# Patient Record
Sex: Female | Born: 1955 | Race: White | Hispanic: No | Marital: Married | Smoking: Never smoker
Health system: Southern US, Community
[De-identification: ages and names within clinical notes are randomized; demographics above are authoritative.]

## PROBLEM LIST (undated history)

## (undated) DIAGNOSIS — M1990 Unspecified osteoarthritis, unspecified site: Secondary | ICD-10-CM

## (undated) DIAGNOSIS — G43909 Migraine, unspecified, not intractable, without status migrainosus: Secondary | ICD-10-CM

## (undated) DIAGNOSIS — I493 Ventricular premature depolarization: Secondary | ICD-10-CM

## (undated) DIAGNOSIS — E785 Hyperlipidemia, unspecified: Secondary | ICD-10-CM

## (undated) DIAGNOSIS — I10 Essential (primary) hypertension: Secondary | ICD-10-CM

## (undated) DIAGNOSIS — I4719 Other supraventricular tachycardia: Secondary | ICD-10-CM

## (undated) DIAGNOSIS — K219 Gastro-esophageal reflux disease without esophagitis: Secondary | ICD-10-CM

## (undated) DIAGNOSIS — I472 Ventricular tachycardia: Secondary | ICD-10-CM

## (undated) DIAGNOSIS — E039 Hypothyroidism, unspecified: Secondary | ICD-10-CM

## (undated) DIAGNOSIS — I471 Supraventricular tachycardia: Secondary | ICD-10-CM

## (undated) DIAGNOSIS — I491 Atrial premature depolarization: Secondary | ICD-10-CM

## (undated) DIAGNOSIS — R000 Tachycardia, unspecified: Secondary | ICD-10-CM

## (undated) HISTORY — DX: Atrial premature depolarization: I49.1

## (undated) HISTORY — DX: Tachycardia, unspecified: R00.0

## (undated) HISTORY — DX: Essential (primary) hypertension: I10

## (undated) HISTORY — DX: Ventricular tachycardia: I47.2

## (undated) HISTORY — DX: Other supraventricular tachycardia: I47.19

## (undated) HISTORY — DX: Migraine, unspecified, not intractable, without status migrainosus: G43.909

## (undated) HISTORY — DX: Gastro-esophageal reflux disease without esophagitis: K21.9

## (undated) HISTORY — DX: Ventricular premature depolarization: I49.3

## (undated) HISTORY — DX: Supraventricular tachycardia: I47.1

## (undated) HISTORY — DX: Unspecified osteoarthritis, unspecified site: M19.90

## (undated) HISTORY — DX: Hyperlipidemia, unspecified: E78.5

## (undated) HISTORY — DX: Hypothyroidism, unspecified: E03.9

---
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
## 1993-10-30 HISTORY — PX: TUBAL LIGATION: SHX77

## 2003-10-18 ENCOUNTER — Emergency Department (HOSPITAL_COMMUNITY): Admission: EM | Admit: 2003-10-18 | Discharge: 2003-10-18 | Payer: Self-pay | Admitting: Emergency Medicine

## 2003-10-27 ENCOUNTER — Inpatient Hospital Stay (HOSPITAL_COMMUNITY): Admission: RE | Admit: 2003-10-27 | Discharge: 2003-10-29 | Payer: Self-pay | Admitting: *Deleted

## 2003-10-28 ENCOUNTER — Encounter: Payer: Self-pay | Admitting: Cardiology

## 2004-09-05 ENCOUNTER — Ambulatory Visit: Payer: Self-pay | Admitting: Internal Medicine

## 2004-09-27 ENCOUNTER — Ambulatory Visit: Payer: Self-pay | Admitting: Internal Medicine

## 2005-07-13 ENCOUNTER — Ambulatory Visit: Payer: Self-pay | Admitting: Internal Medicine

## 2010-08-04 ENCOUNTER — Encounter: Payer: Self-pay | Admitting: Internal Medicine

## 2010-11-29 NOTE — Letter (Signed)
Summary: Colonoscopy Letter  [facility]  [address] [place], [place] [address]   Phone: [phone]  Fax: [phone]      August 04, 2010 MRN: [id number]   YOSHIYE KRAFT [address] [place], [place]  [address]   Dear Ms. Laurelyn Sickle,   According to your medical record, it is time for you to schedule a Colonoscopy. The American Cancer Society recommends this procedure as a method to detect early colon cancer. Patients with a family history of colon cancer, or a personal history of colon polyps or inflammatory bowel disease are at increased risk.  This letter has been generated based on the recommendations made at the time of your procedure. If you feel that in your particular situation this may no longer apply, please contact our office.  Please call our office at [phone] to schedule this appointment or to update your records at your earliest convenience.  Thank you for cooperating with [place] to provide you with the very best care possible.   Sincerely,  Wilhemina Bonito. Marina Goodell, M.D.  [facility] Gastroenterology Division [phone]

## 2011-03-17 NOTE — Discharge Summary (Signed)
NAME:  ROXANN, VIERRA NO.:  [id number]   MEDICAL RECORD NO.:  [id number]                   PATIENT TYPE:  INP   LOCATION:  2035                                 FACILITY:  [facility]   PHYSICIAN:  Cecil Cranker, M.D.             DATE OF BIRTH:  Sep 14, 1956   DATE OF ADMISSION:  10/27/2003  DATE OF DISCHARGE:  10/29/2003                                 DISCHARGE SUMMARY   PROCEDURES:  1. Cardiac catheterization.  2. Coronary arteriogram.  3. Left ventriculogram.  4. A 2-D echocardiogram.  5. Esophagogastroduodenoscopy.   HOSPITAL COURSE:  Ms. Taylor Alvarado is a 55 year old female with no known history  of coronary artery disease.  She was seen in the office on October 27, 2003  and her cardiac risk factors include strong family history of coronary  artery disease as well as hypertension.  Her symptoms were concerning for  unstable anginal pain and she was admitted to the hospital for further  evaluation and treatment.   Ms. Marner enzymes were negative for MI but it was felt that a cardiac  catheterization was indicated and this was performed on October 28, 2003.  The cardiac catheterization showed normal coronaries as well as an abdominal  aortogram that was normal with no significant stenosis in the renal  arteries.  Her EF was approximately 50% with no wall motion abnormalities.  A GI consult was called to further evaluate her symptoms.   As part of her evaluation additionally an echocardiogram was performed on  October 29, 2003.  The echocardiogram showed a normal EF with no  significant wall motion abnormalities.  There was trivial mitral valvular  regurgitation and no evidence of problems in the aortic valve or in the  tricuspid valve.  There was no pericardial effusion.  An EGD was also  performed on October 29, 2003 and it showed a small hiatal hernia with no  intervention needed and no complications.  Dr. Marina Goodell evaluated Ms. Guidroz  and felt that if her chest pain continued despite b.i.d. Protonix  gallbladder ultrasound would be an appropriate next step.   After the EGD Ms. Schriner was symptom-free with her blood pressure within  normal limits.  She had had Toprol-XL added to her medication regimen and  tolerated this.  On review of her strips a five-beat run of nonsustained VT  was seen as well as some other PVCs.  Because of the arrhythmia a beta  blocker was consider an appropriate long-term drug for her.  Additionally,  she had a potassium of 3.2 but this was supplemented with 40 mEq.  She has  no history of hypokalemia and was not given any kind of diuretic while she  was here.  This was felt secondary to IV fluids for hydration after the  catheterization.  Ms. Ogden had her potassium supplemented and was  considered stable for discharge on October 29, 2003 with outpatient follow-  up arranged.   LABORATORY VALUES:  Hemoglobin 11.8, hematocrit 35.7, wbc's 4.8, platelets  240.  Sodium 140, potassium 3.2, chloride 111, CO2 23, BUN 9, creatinine  0.9, glucose 87, calcium 8.5.  Total cholesterol 173, triglycerides 68, HDL  57, LDL 102.   Chest x-ray:  The lungs are clear with heart and mediastinal contours  normal.  Stable exam.   DISCHARGE CONDITION:  Improved.   DISCHARGE DIAGNOSES:  1. Chest pain, no significant coronary artery disease by catheterization and     hiatal hernia only abnormal on esophagogastroduodenoscopy.  Follow up     with primary care physician with gallbladder ultrasound recommended by     Dr. Marina Goodell if symptoms continue despite b.i.d. Protonix.  2. Borderline hypertension.  3. Asymptomatic nonsustained ventricular tachycardia with a normal ejection     fraction (no further workup indicated at this time).  4. History of allergy to PENICILLIN and SEPTRA.  5. Gastroesophageal reflux disease symptoms.  6. Family history of premature coronary artery disease.  7. Status post bilateral  tubal ligation and cesarean section x2.   DISCHARGE INSTRUCTIONS:  1. Her activity level is to include no driving, sexual, or strenuous     activity for two days.  2. She is to call the office for problems with the catheterization site.  3. She is to stick to a low fat diet.  4. She has a follow-up appointment with Dr. Charlies Constable on November 18, 2003     at 2:15 p.m. (the patient requested follow-up with him).  She is to     follow up with Dr. Evlyn Kanner and follow up with Dr. Marina Goodell p.r.n.   DISCHARGE MEDICATIONS:  1. Protonix 40 mg p.o. b.i.d.  2. Toprol-XL 25 mg daily.      Theodore Demark, P.A. [facility]                  E. Graceann Congress, M.D.    RB/MEDQ  D:  10/29/2003  T:  10/29/2003  Job:  [id number]   cc:   Jeannett Senior A. Evlyn Kanner, M.D.  [address]  [place]  [place] [address]  Fax: [phone]   Charlies Constable, M.D.   Wilhemina Bonito. Marina Goodell, M.D. [facility]

## 2011-03-17 NOTE — Cardiovascular Report (Signed)
NAME:  Taylor Alvarado, Taylor Alvarado NO.:  [id number]   MEDICAL RECORD NO.:  [id number]                   PATIENT TYPE:  INP   LOCATION:  2035                                 FACILITY:  [facility]   PHYSICIAN:  Charlies Constable, M.D.                  DATE OF BIRTH:  1956-01-06   DATE OF PROCEDURE:  10/28/2003  DATE OF DISCHARGE:                              CARDIAC CATHETERIZATION   CLINICAL HISTORY:  Ms. Blaszczyk is 55 years old and is the daughter of Mr.  and Mrs. Doyne Keel.  Over the past two weeks she has had substernal  chest pain and burning both at rest and with exertion.  She was seen in  consultation by Dr. Corinda Gubler yesterday who admitted her to the hospital for  evaluation.  Last night she had a five beat run of ventricular tachycardia.  Her CKs and troponins were negative.   PROCEDURE:  The procedure was performed via the right femoral artery using  arterial sheath and 6-French preformed coronary catheters.  A front wall  arterial puncture was performed and Omnipaque contrast was used.  A distal  aortogram was performed to rule out abdominal aortic aneurysm.  Right  femoral artery was closed with AngioSeal at the end of the procedure.  The  patient tolerated the procedure well and left the laboratory in satisfactory  condition.   RESULTS:  Left main coronary artery:  Free of significant disease.   Left anterior descending artery:  Gave rise to two diagonal branches and two  septal perforators.  These and the LAD proper were free of significant  disease.   Circumflex artery:  Gave rise to an intermediate branch, two small marginal  branches, and two posterolateral branches.  These vessels were free of  significant disease.   Right coronary artery:  Moderate sized vessel.  Gave rise to a right  ventricular branch, posterior descending branch, and a posterolateral  branch.  These vessels were free of significant disease.   LEFT VENTRICULOGRAM:  The left  ventriculogram performed in the RAO  projection showed good wall motion with no areas of hypokinesis.  The  estimated ejection fraction was 60%.   DISTAL AORTOGRAM:  A distal aortogram was performed which showed patent  renal arteries and no significant iliac obstruction.   The aortic pressure was 157/88 with a mean of 116.  The left ventricular  pressure was 167/18.   CONCLUSIONS:  Normal coronary angiography and left ventricular wall motion.   RECOMMENDATIONS:  Reassurance.  I am not certain regarding the etiology of  Ms. Pomplun's chest pain.  I will discuss the findings with Dr. Corinda Gubler and  will decide about further evaluation.  Charlies Constable, M.D.    BB/MEDQ  D:  10/28/2003  T:  10/28/2003  Job:  [id number]   cc:   Jeannett Senior A. Evlyn Kanner, M.D.  [address]  [place]  [place] [address]  Fax: [phone]   [facility]

## 2011-03-17 NOTE — H&P (Signed)
NAME:  Taylor Alvarado, Taylor Alvarado NO.:  [id number]   MEDICAL RECORD NO.:  [id number]                   PATIENT TYPE:  EMS   LOCATION:  MAJO                                 FACILITY:  [facility]   PHYSICIAN:  Cecil Cranker, M.D.             DATE OF BIRTH:  1956/09/20   DATE OF ADMISSION:  10/18/2003  DATE OF DISCHARGE:  10/18/2003                                HISTORY & PHYSICAL   CARDIOLOGIST:  None -- she prefers to see Dr. Charlies Constable.   PRIMARY CARE PHYSICIAN:  Dr. Tera Mater. [place].   CHIEF COMPLAINT:  Chest pain.   HISTORY OF PRESENT ILLNESS:  Ms. Taylor Alvarado is a very pleasant 55 year old  married white female with no known history of coronary artery disease, who  was referred to our office by Dr. Evlyn Kanner for the evaluation of chest pain.  She has had about six to eight episodes of chest discomfort since October 12, 2003.  She actually went to the emergency room on October 20, 2003 and  was evaluated there.  Her EKG was normal and point-of-care cardiac enzymes  were negative x3.  She was started on Protonix.  She notes occasional chest  pressure and this will eventually result in chest-burning.  She sometimes  gets this at rest.  She also sometimes gets it with exertion.  She has noted  it recently when she goes out into the cold weather, especially when she  goes up inclines.  She denies any radiation to her arms or jaw.  She denies  any associated diaphoresis, nausea, syncope.  She denies any true dyspnea  but does note that when she breathes deeply, it makes her pain better.  She  has felt dizzy with her symptoms.  Since starting on Protonix, she has noted  that her chest-burning is a little bit better but has not resolved.   PAST MEDICAL HISTORY:  Past medical history is significant for vertigo and  she has been seen by an ear, nose and throat physician in the past for this.  She is status post bilateral tubal ligation, status post C-section.  She  denies any history of diabetes, hypertension, hypercholesterolemia or  thyroid disease.   ALLERGIES:  PENICILLIN and SEPTRA.   MEDICATIONS:  Protonix 40 mg a day.   SOCIAL HISTORY:  The patient lives in [place] with her husband.  She is a  Runner, broadcasting/film/video at [employer].  She has two children.  She denies any  tobacco or alcohol abuse.   FAMILY HISTORY:  Family history is significant for coronary artery disease.  Her father had a bypass operation at age 71.  Her brother died of  complications secondary to diabetes and had a bypass operation at age 20.   REVIEW OF SYSTEMS:  Please see HPI.  She denies any fevers, chills, sweats,  headache, sore throat, rash.  She denies any claudication.  She denies any  cough.  She denies any orthopnea, paroxysmal nocturnal dyspnea or edema.  She denies any dysuria or hematuria.  She denies any numbness or tingling.  She denies any myalgias or arthralgias.  She denies any nausea, vomiting,  diarrhea, bright red blood per rectum, melena, dysphagia or odynophagia.  She denies any skin or hair changes.   PHYSICAL EXAMINATION:  GENERAL:  She is a well-nourished, well-developed  female in no acute distress.  VITAL SIGNS:  Blood pressure is 150/98, pulse 100, respirations 15.  HEENT:  Normocephalic, atraumatic.  PERRLA.  EOMI.  NECK:  Neck without lymphadenopathy, thyromegaly, bruits or JVD.  LYMPH:  Without lymphadenopathy.  CARDIOVASCULAR:  Heart regular rate and rhythm.  Normal S1 and S2.  No  murmurs.  LUNGS:  Lungs are clear to auscultation bilaterally.  SKIN:  Skin without rashes.  ABDOMEN:  Abdomen soft and nontender with normoactive bowel sounds.  No  hepatosplenomegaly.  No HJR.  EXTREMITIES:  Extremities without clubbing, cyanosis, or edema.  MUSCULOSKELETAL:  No spine or CVA tenderness.  NEUROLOGIC:  No focal deficits noted.  ENDOCRINE:  No thyromegaly noted.   ACCESSORY CLINICAL DATA:  EKG reveals sinus rhythm at a ventricular  rate of  83, left axis deviation and no ischemic changes.   IMPRESSION:  1. Unstable angina.  2. Strong family history of coronary artery disease.  3. Elevated blood pressure.  4. Possible gastroesophageal reflux disease.   PLAN:  The patient was also seen by Dr. Cecil Cranker today.  We plan to  admit her from our office directly to [facility].  We will treat  her with aspirin, heparin and Lopressor 25 mg twice a day.  We will check  serial cardiac enzymes.  We plan cardiac catheterization tomorrow.      Taylor Alvarado, Anders Simmonds, M.D.    SW/MEDQ  D:  10/27/2003  T:  10/27/2003  Job:  [id number]   cc:   Tera Mater. Evlyn Kanner, M.D.  [address]  [place]  [place] [address]  Fax: [phone]

## 2012-07-04 ENCOUNTER — Other Ambulatory Visit: Payer: Self-pay | Admitting: Radiology

## 2012-07-19 ENCOUNTER — Encounter: Payer: Self-pay | Admitting: Internal Medicine

## 2013-05-07 ENCOUNTER — Other Ambulatory Visit: Payer: Self-pay

## 2014-04-15 ENCOUNTER — Encounter: Payer: Self-pay | Admitting: Internal Medicine

## 2014-06-11 ENCOUNTER — Ambulatory Visit (AMBULATORY_SURGERY_CENTER): Payer: BC Managed Care – PPO | Admitting: *Deleted

## 2014-06-11 VITALS — Ht 63.0 in | Wt 181.0 lb

## 2014-06-11 DIAGNOSIS — Z1211 Encounter for screening for malignant neoplasm of colon: Secondary | ICD-10-CM

## 2014-06-11 MED ORDER — MOVIPREP 100 G PO SOLR: ORAL | Status: DC

## 2014-06-11 NOTE — Progress Notes (Signed)
No allergies to eggs or soy. No problems with anesthesia.  Pt given Emmi instructions for colonoscopy  No oxygen use  No diet drug use  

## 2014-06-22 ENCOUNTER — Encounter: Payer: Self-pay | Admitting: Internal Medicine

## 2014-06-25 ENCOUNTER — Other Ambulatory Visit: Payer: Self-pay | Admitting: Internal Medicine

## 2014-06-25 ENCOUNTER — Encounter: Payer: Self-pay | Admitting: Internal Medicine

## 2014-06-25 ENCOUNTER — Ambulatory Visit (AMBULATORY_SURGERY_CENTER): Payer: BC Managed Care – PPO | Admitting: Internal Medicine

## 2014-06-25 VITALS — BP 108/84 | HR 57 | Temp 97.9°F | Resp 15 | Ht 63.0 in | Wt 181.0 lb

## 2014-06-25 DIAGNOSIS — Z1211 Encounter for screening for malignant neoplasm of colon: Secondary | ICD-10-CM

## 2014-06-25 DIAGNOSIS — D126 Benign neoplasm of colon, unspecified: Secondary | ICD-10-CM

## 2014-06-25 MED ORDER — SODIUM CHLORIDE 0.9 % IV SOLN: 500.0000 mL | INTRAVENOUS | Status: DC

## 2014-06-25 NOTE — Op Note (Signed)
[facility]  [address] [place], [address]   COLONOSCOPY PROCEDURE REPORT  PATIENT: Corinthia, Helmers  MR#: [id number] BIRTHDATE: 1956-06-30 , 83  yrs. old GENDER: Female ENDOSCOPIST: Eustace Quail, MD REFERRED GG:YIRSWNIOE Recall, M.D. PROCEDURE DATE:  06/25/2014 PROCEDURE:   Colonoscopy with snare polypectomy x 1 First Screening Colonoscopy - Avg.  risk and is 50 yrs.  old or older - No.  Prior Negative Screening - Now for repeat screening. 10 or more years since last screening  History of Adenoma - Now for follow-up colonoscopy & has been > or = to 3 yrs.  N/A  Polyps Removed Today? Yes. ASA CLASS:   Class II INDICATIONS:average risk screening.   Prior exam 2005 (-) MEDICATIONS: MAC sedation, administered by CRNA and propofol (Diprivan) 300mg  IV  DESCRIPTION OF PROCEDURE:   After the risks benefits and alternatives of the procedure were thoroughly explained, informed consent was obtained.  A digital rectal exam revealed no abnormalities of the rectum.   The LB [id number]  endoscope was introduced through the anus and advanced to the cecum, which was identified by both the appendix and ileocecal valve. No adverse events experienced.   The quality of the prep was good, using MoviPrep  The instrument was then slowly withdrawn as the colon was fully examined.  COLON FINDINGS: A diminutive polyp was found at the cecum.  A polypectomy was performed with a cold snare.  The resection was complete and the polyp tissue was completely retrieved.   Mild diverticulosis was noted in the sigmoid colon.   The colon mucosa was otherwise normal.  Retroflexed views revealed internal hemorrhoids. The time to cecum=2 minutes 32 seconds.  Withdrawal time=11 minutes 54 seconds.  The scope was withdrawn and the procedure completed. COMPLICATIONS: There were no complications.  ENDOSCOPIC IMPRESSION: 1.   Diminutive polyp was found at the cecum; polypectomy  was performed with a cold snare 2.   Mild diverticulosis was noted in the sigmoid colon 3.   The colon mucosa was otherwise normal  RECOMMENDATIONS: 1. Repeat colonoscopy in 5 years if polyp adenomatous; otherwise 10 years   eSigned:  Eustace Quail, MD 06/25/2014 8:42 AM   cc: Reynold Bowen, MD and The Patient

## 2014-06-25 NOTE — Progress Notes (Signed)
Called to room to assist during endoscopic procedure.  Patient ID and intended procedure confirmed with present staff. Received instructions for my participation in the procedure from the performing physician.Called to room to assist during endoscopic procedure.  Patient ID and intended procedure confirmed with present staff. Received instructions for my participation in the procedure from the performing physician. 

## 2014-06-25 NOTE — Progress Notes (Signed)
Report to PACU, RN, vss, BBS= Clear.  

## 2014-06-25 NOTE — Patient Instructions (Signed)
YOU HAD AN ENDOSCOPIC PROCEDURE TODAY AT [facility]: Refer to the procedure report that was given to you for any specific questions about what was found during the examination.  If the procedure report does not answer your questions, please call your gastroenterologist to clarify.  If you requested that your care partner not be given the details of your procedure findings, then the procedure report has been included in a sealed envelope for you to review at your convenience later.  YOU SHOULD EXPECT: Some feelings of bloating in the abdomen. Passage of more gas than usual.  Walking can help get rid of the air that was put into your GI tract during the procedure and reduce the bloating. If you had a lower endoscopy (such as a colonoscopy or flexible sigmoidoscopy) you may notice spotting of blood in your stool or on the toilet paper. If you underwent a bowel prep for your procedure, then you may not have a normal bowel movement for a few days.  DIET: Your first meal following the procedure should be a light meal and then it is ok to progress to your normal diet.  A half-sandwich or bowl of soup is an example of a good first meal.  Heavy or fried foods are harder to digest and may make you feel nauseous or bloated.  Likewise meals heavy in dairy and vegetables can cause extra gas to form and this can also increase the bloating.  Drink plenty of fluids but you should avoid alcoholic beverages for 24 hours.  ACTIVITY: Your care partner should take you home directly after the procedure.  You should plan to take it easy, moving slowly for the rest of the day.  You can resume normal activity the day after the procedure however you should NOT DRIVE or use heavy machinery for 24 hours (because of the sedation medicines used during the test).    SYMPTOMS TO REPORT IMMEDIATELY: A gastroenterologist can be reached at any hour.  During normal business hours, 8:30 AM to 5:00 PM Monday through Friday,  call [phone].  After hours and on weekends, please call the GI answering service at [phone] who will take a message and have the physician on call contact you.   Following lower endoscopy (colonoscopy or flexible sigmoidoscopy):  Excessive amounts of blood in the stool  Significant tenderness or worsening of abdominal pains  Swelling of the abdomen that is new, acute  Fever of 100F or higher   Diverticulosis Diverticulosis is the condition that develops when small pouches (diverticula) form in the wall of your colon. Your colon, or large intestine, is where water is absorbed and stool is formed. The pouches form when the inside layer of your colon pushes through weak spots in the outer layers of your colon. CAUSES  No one knows exactly what causes diverticulosis. RISK FACTORS  Being older than 75. Your risk for this condition increases with age. Diverticulosis is rare in people younger than 40 years. By age 58, almost everyone has it.  Eating a low-fiber diet.  Being frequently constipated.  Being overweight.  Not getting enough exercise.  Smoking.  Taking over-the-counter pain medicines, like aspirin and ibuprofen. SYMPTOMS  Most people with diverticulosis do not have symptoms. DIAGNOSIS  Because diverticulosis often has no symptoms, health care providers often discover the condition during an exam for other colon problems. In many cases, a health care provider will diagnose diverticulosis while using a flexible scope to examine the colon (colonoscopy).  TREATMENT  If you have never developed an infection related to diverticulosis, you may not need treatment. If you have had an infection before, treatment may include:  Eating more fruits, vegetables, and grains.  Taking a fiber supplement.  Taking a live bacteria supplement (probiotic).  Taking medicine to relax your colon. HOME CARE INSTRUCTIONS   Drink at least 6-8 glasses of water each day to prevent  constipation.  Try not to strain when you have a bowel movement.  Keep all follow-up appointments. If you have had an infection before:  Increase the fiber in your diet as directed by your health care provider or dietitian.  Take a dietary fiber supplement if your health care provider approves.  Only take medicines as directed by your health care provider. SEEK MEDICAL CARE IF:   You have abdominal pain.  You have bloating.  You have cramps.  Polyp and high-fiber diet information given.  You have not gone to the bathroom in 3 days. SEEK IMMEDIATE MEDICAL CARE IF:   Your pain gets worse.  Yourbloating becomes very bad.  You have a fever or chills, and your symptoms suddenly get worse.  You begin vomiting.  You have bowel movements that are bloody or black. MAKE SURE YOU:  Understand these instructions.  Will watch your condition.  Will get help right away if you are not doing well or get worse. Document Released: 07/13/2004 Document Revised: 10/21/2013 Document Reviewed: 09/10/2013 [facility] Patient Information 2015 [place], [place]. This information is not intended to replace advice given to you by your health care provider. Make sure you discuss any questions you have with your health care provider.  FOLLOW UP: If any biopsies were taken you will be contacted by phone or by letter within the next 1-3 weeks.  Call your gastroenterologist if you have not heard about the biopsies in 3 weeks.  Our staff will call the home number listed on your records the next business day following your procedure to check on you and address any questions or concerns that you may have at that time regarding the information given to you following your procedure. This is a courtesy call and so if there is no answer at the home number and we have not heard from you through the emergency physician on call, we will assume that you have returned to your regular daily activities without  incident.  SIGNATURES/CONFIDENTIALITY: You and/or your care partner have signed paperwork which will be entered into your electronic medical record.  These signatures attest to the fact that that the information above on your After Visit Summary has been reviewed and is understood.  Full responsibility of the confidentiality of this discharge information lies with you and/or your care-partner.

## 2014-06-26 ENCOUNTER — Telehealth: Payer: Self-pay | Admitting: *Deleted

## 2014-06-26 NOTE — Telephone Encounter (Signed)
Left message that we called for f/u 

## 2014-06-30 ENCOUNTER — Encounter: Payer: Self-pay | Admitting: Internal Medicine

## 2016-02-16 ENCOUNTER — Other Ambulatory Visit: Payer: Self-pay | Admitting: Endocrinology

## 2016-02-16 DIAGNOSIS — N644 Mastodynia: Secondary | ICD-10-CM

## 2016-03-06 ENCOUNTER — Other Ambulatory Visit: Payer: BC Managed Care – PPO

## 2018-10-30 DIAGNOSIS — I10 Essential (primary) hypertension: Secondary | ICD-10-CM

## 2018-10-30 HISTORY — DX: Essential (primary) hypertension: I10

## 2018-12-31 ENCOUNTER — Ambulatory Visit: Payer: BC Managed Care – PPO | Admitting: Neurology

## 2018-12-31 ENCOUNTER — Encounter: Payer: Self-pay | Admitting: Neurology

## 2018-12-31 VITALS — BP 184/110 | HR 70 | Ht 63.0 in | Wt 198.0 lb

## 2018-12-31 DIAGNOSIS — R51 Headache: Secondary | ICD-10-CM | POA: Diagnosis not present

## 2018-12-31 DIAGNOSIS — R519 Headache, unspecified: Secondary | ICD-10-CM

## 2018-12-31 DIAGNOSIS — R0683 Snoring: Secondary | ICD-10-CM

## 2018-12-31 DIAGNOSIS — R400 Somnolence: Secondary | ICD-10-CM

## 2018-12-31 DIAGNOSIS — G43001 Migraine without aura, not intractable, with status migrainosus: Secondary | ICD-10-CM | POA: Diagnosis not present

## 2018-12-31 MED ORDER — RIZATRIPTAN BENZOATE 10 MG PO TBDP: 10.0000 mg | ORAL_TABLET | ORAL | 11 refills | Status: DC | PRN

## 2018-12-31 NOTE — Patient Instructions (Addendum)
Dr. Brett Fairy Sleep evaluation Maxalt(Riztriptan): Please take one tablet at the onset of your headache. If it does not improve the symptoms please take one additional tablet. Do not take more then 2 tablets in 24hrs. Do not take use more then 2 to 3 times in a week.   Sleep Apnea Sleep apnea is a condition in which breathing pauses or becomes shallow during sleep. Episodes of sleep apnea usually last 10 seconds or longer, and they may occur as many as 20 times an hour. Sleep apnea disrupts your sleep and keeps your body from getting the rest that it needs. This condition can increase your risk of certain health problems, including:  Heart attack.  Stroke.  Obesity.  Diabetes.  Heart failure.  Irregular heartbeat. There are three kinds of sleep apnea:  Obstructive sleep apnea. This kind is caused by a blocked or collapsed airway.  Central sleep apnea. This kind happens when the part of the brain that controls breathing does not send the correct signals to the muscles that control breathing.  Mixed sleep apnea. This is a combination of obstructive and central sleep apnea. What are the causes? The most common cause of this condition is a collapsed or blocked airway. An airway can collapse or become blocked if:  Your throat muscles are abnormally relaxed.  Your tongue and tonsils are larger than normal.  You are overweight.  Your airway is smaller than normal. What increases the risk? This condition is more likely to develop in people who:  Are overweight.  Smoke.  Have a smaller than normal airway.  Are elderly.  Are female.  Drink alcohol.  Take sedatives or tranquilizers.  Have a family history of sleep apnea. What are the signs or symptoms? Symptoms of this condition include:  Trouble staying asleep.  Daytime sleepiness and tiredness.  Irritability.  Loud snoring.  Morning headaches.  Trouble concentrating.  Forgetfulness.  Decreased interest in  sex.  Unexplained sleepiness.  Mood swings.  Personality changes.  Feelings of depression.  Waking up often during the night to urinate.  Dry mouth.  Sore throat. How is this diagnosed? This condition may be diagnosed with:  A medical history.  A physical exam.  A series of tests that are done while you are sleeping (sleep study). These tests are usually done in a sleep lab, but they may also be done at home. How is this treated? Treatment for this condition aims to restore normal breathing and to ease symptoms during sleep. It may involve managing health issues that can affect breathing, such as high blood pressure or obesity. Treatment may include:  Sleeping on your side.  Using a decongestant if you have nasal congestion.  Avoiding the use of depressants, including alcohol, sedatives, and narcotics.  Losing weight if you are overweight.  Making changes to your diet.  Quitting smoking.  Using a device to open your airway while you sleep, such as: ? An oral appliance. This is a custom-made mouthpiece that shifts your lower jaw forward. ? A continuous positive airway pressure (CPAP) device. This device delivers oxygen to your airway through a mask. ? A nasal expiratory positive airway pressure (EPAP) device. This device has valves that you put into each nostril. ? A bi-level positive airway pressure (BPAP) device. This device delivers oxygen to your airway through a mask.  Surgery if other treatments do not work. During surgery, excess tissue is removed to create a wider airway. It is important to get treatment for sleep apnea.  Without treatment, this condition can lead to:  High blood pressure.  Coronary artery disease.  (Men) An inability to achieve or maintain an erection (impotence).  Reduced thinking abilities. Follow these instructions at home:  Make any lifestyle changes that your health care provider recommends.  Eat a healthy, well-balanced  diet.  Take over-the-counter and prescription medicines only as told by your health care provider.  Avoid using depressants, including alcohol, sedatives, and narcotics.  Take steps to lose weight if you are overweight.  If you were given a device to open your airway while you sleep, use it only as told by your health care provider.  Do not use any tobacco products, such as cigarettes, chewing tobacco, and e-cigarettes. If you need help quitting, ask your health care provider.  Keep all follow-up visits as told by your health care provider. This is important. Contact a health care provider if:  The device that you received to open your airway during sleep is uncomfortable or does not seem to be working.  Your symptoms do not improve.  Your symptoms get worse. Get help right away if:  You develop chest pain.  You develop shortness of breath.  You develop discomfort in your back, arms, or stomach.  You have trouble speaking.  You have weakness on one side of your body.  You have drooping in your face. These symptoms may represent a serious problem that is an emergency. Do not wait to see if the symptoms will go away. Get medical help right away. Call your local emergency services (911 in the [place] Do not drive yourself to the hospital. This information is not intended to replace advice given to you by your health care provider. Make sure you discuss any questions you have with your health care provider. Document Released: 10/06/2002 Document Revised: 05/14/2017 Document Reviewed: 07/26/2015 Elsevier Interactive Patient Education  2019 [place].   Migraine Headache A migraine headache is an intense, throbbing pain on one side or both sides of the head. Migraines may also cause other symptoms, such as nausea, vomiting, and sensitivity to light and noise. What are the causes? Doing or taking certain things may also trigger migraines, such  as:  Alcohol.  Smoking.  Medicines, such as: ? Medicine used to treat chest pain (nitroglycerine). ? Birth control pills. ? Estrogen pills. ? Certain blood pressure medicines.  Aged cheeses, chocolate, or caffeine.  Foods or drinks that contain nitrates, glutamate, aspartame, or tyramine.  Physical activity. Other things that may trigger a migraine include:  Menstruation.  Pregnancy.  Hunger.  Stress, lack of sleep, too much sleep, or fatigue.  Weather changes. What increases the risk? The following factors may make you more likely to experience migraine headaches:  Age. Risk increases with age.  Family history of migraine headaches.  Being Caucasian.  Depression and anxiety.  Obesity.  Being a woman.  Having a hole in the heart (patent foramen ovale) or other heart problems. What are the signs or symptoms? The main symptom of this condition is pulsating or throbbing pain. Pain may:  Happen in any area of the head, such as on one side or both sides.  Interfere with daily activities.  Get worse with physical activity.  Get worse with exposure to bright lights or loud noises. Other symptoms may include:  Nausea.  Vomiting.  Dizziness.  General sensitivity to bright lights, loud noises, or smells. Before you get a migraine, you may get warning signs that a migraine is developing (aura).  An aura may include:  Seeing flashing lights or having blind spots.  Seeing bright spots, halos, or zigzag lines.  Having tunnel vision or blurred vision.  Having numbness or a tingling feeling.  Having trouble talking.  Having muscle weakness. How is this diagnosed? A migraine headache can be diagnosed based on:  Your symptoms.  A physical exam.  Tests, such as CT scan or MRI of the head. These imaging tests can help rule out other causes of headaches.  Taking fluid from the spine (lumbar puncture) and analyzing it (cerebrospinal fluid analysis, or CSF  analysis). How is this treated? A migraine headache is usually treated with medicines that:  Relieve pain.  Relieve nausea.  Prevent migraines from coming back. Treatment may also include:  Acupuncture.  Lifestyle changes like avoiding foods that trigger migraines. Follow these instructions at home: Medicines  Take over-the-counter and prescription medicines only as told by your health care provider.  Do not drive or use heavy machinery while taking prescription pain medicine.  To prevent or treat constipation while you are taking prescription pain medicine, your health care provider may recommend that you: ? Drink enough fluid to keep your urine clear or pale yellow. ? Take over-the-counter or prescription medicines. ? Eat foods that are high in fiber, such as fresh fruits and vegetables, whole grains, and beans. ? Limit foods that are high in fat and processed sugars, such as fried and sweet foods. Lifestyle  Avoid alcohol use.  Do not use any products that contain nicotine or tobacco, such as cigarettes and e-cigarettes. If you need help quitting, ask your health care provider.  Get at least 8 hours of sleep every night.  Limit your stress. General instructions      Keep a journal to find out what may trigger your migraine headaches. For example, write down: ? What you eat and drink. ? How much sleep you get. ? Any change to your diet or medicines.  If you have a migraine: ? Avoid things that make your symptoms worse, such as bright lights. ? It may help to lie down in a dark, quiet room. ? Do not drive or use heavy machinery. ? Ask your health care provider what activities are safe for you while you are experiencing symptoms.  Keep all follow-up visits as told by your health care provider. This is important. Contact a health care provider if:  You develop symptoms that are different or more severe than your usual migraine symptoms. Get help right away  if:  Your migraine becomes severe.  You have a fever.  You have a stiff neck.  You have vision loss.  Your muscles feel weak or like you cannot control them.  You start to lose your balance often.  You develop trouble walking.  You faint. This information is not intended to replace advice given to you by your health care provider. Make sure you discuss any questions you have with your health care provider. Document Released: 10/16/2005 Document Revised: 05/05/2016 Document Reviewed: 04/03/2016 Elsevier Interactive Patient Education  2019 [employer]  Rizatriptan disintegrating tablets What is this medicine? RIZATRIPTAN (rye za TRIP tan) is used to treat migraines with or without aura. An aura is a strange feeling or visual disturbance that warns you of an attack. It is not used to prevent migraines. This medicine may be used for other purposes; ask your health care provider or pharmacist if you have questions. COMMON BRAND NAME(S): Maxalt-MLT What should I tell my health  care provider before I take this medicine? They need to know if you have any of these conditions: -cigarette smoker -circulation problems in fingers and toes -diabetes -heart disease -high blood pressure -high cholesterol -history of irregular heartbeat -history of stroke -kidney disease -liver disease -stomach or intestine problems -an unusual or allergic reaction to rizatriptan, other medicines, foods, dyes, or preservatives -pregnant or trying to get pregnant -breast-feeding How should I use this medicine? Take this medicine by mouth. Follow the directions on the prescription label. Leave the tablet in the sealed blister pack until you are ready to take it. With dry hands, open the blister and gently remove the tablet. If the tablet breaks or crumbles, throw it away and take a new tablet out of the blister pack. Place the tablet in the mouth and allow it to dissolve, and then swallow. Do not cut,  crush, or chew this medicine. You do not need water to take this medicine. Do not take it more often than directed. Talk to your pediatrician regarding the use of this medicine in children. While this drug may be prescribed for children as young as 6 years for selected conditions, precautions do apply. Overdosage: If you think you have taken too much of this medicine contact a poison control center or emergency room at once. NOTE: This medicine is only for you. Do not share this medicine with others. What if I miss a dose? This does not apply. This medicine is not for regular use. What may interact with this medicine? Do not take this medicine with any of the following medicines: -certain medicines for migraine headache like almotriptan, eletriptan, frovatriptan, naratriptan, rizatriptan, sumatriptan, zolmitriptan -ergot alkaloids like dihydroergotamine, ergonovine, ergotamine, methylergonovine -MAOIs like Carbex, Eldepryl, Marplan, Nardil, and Parnate This medicine may also interact with the following medications: -certain medicines for depression, anxiety, or psychotic disorders -propranolol This list may not describe all possible interactions. Give your health care provider a list of all the medicines, herbs, non-prescription drugs, or dietary supplements you use. Also tell them if you smoke, drink alcohol, or use illegal drugs. Some items may interact with your medicine. What should I watch for while using this medicine? Visit your healthcare professional for regular checks on your progress. Tell your healthcare professional if your symptoms do not start to get better or if they get worse. You may get drowsy or dizzy. Do not drive, use machinery, or do anything that needs mental alertness until you know how this medicine affects you. Do not stand up or sit up quickly, especially if you are an older patient. This reduces the risk of dizzy or fainting spells. Alcohol may interfere with the effect  of this medicine. Your mouth may get dry. Chewing sugarless gum or sucking hard candy and drinking plenty of water may help. Contact your healthcare professional if the problem does not go away or is severe. If you take migraine medicines for 10 or more days a month, your migraines may get worse. Keep a diary of headache days and medicine use. Contact your healthcare professional if your migraine attacks occur more frequently. What side effects may I notice from receiving this medicine? Side effects that you should report to your doctor or health care professional as soon as possible: -allergic reactions like skin rash, itching or hives, swelling of the face, lips, or tongue -chest pain or chest tightness -signs and symptoms of a dangerous change in heartbeat or heart rhythm like chest pain; dizziness; fast, irregular heartbeat; palpitations; feeling  faint or lightheaded; falls; breathing problems -signs and symptoms of a stroke like changes in vision; confusion; trouble speaking or understanding; severe headaches; sudden numbness or weakness of the face, arm or leg; trouble walking; dizziness; loss of balance or coordination -signs and symptoms of serotonin syndrome like irritable; confusion; diarrhea; fast or irregular heartbeat; muscle twitching; stiff muscles; trouble walking; sweating; high fever; seizures; chills; vomiting Side effects that usually do not require medical attention (report to your doctor or health care professional if they continue or are bothersome): -diarrhea -dizziness -drowsiness -dry mouth -headache -nausea, vomiting -pain, tingling, numbness in the hands or feet -stomach pain This list may not describe all possible side effects. Call your doctor for medical advice about side effects. You may report side effects to FDA at 1-800-FDA-1088. Where should I keep my medicine? Keep out of the reach of children. Store at room temperature between 15 and 30 degrees C (59 and 86  degrees F). Protect from light and moisture. Throw away any unused medicine after the expiration date. NOTE: This sheet is a summary. It may not cover all possible information. If you have questions about this medicine, talk to your doctor, pharmacist, or health care provider.  2019 Elsevier/[employer] Standard (2018-04-30 14:58:08)

## 2018-12-31 NOTE — Progress Notes (Signed)
Epworth Sleepiness Scale 0= would never doze 1= slight chance of dozing 2= moderate chance of dozing 3= high chance of dozing  Sitting and reading: 2 Watching TV: 2 Sitting inactive in a public place (ex. Theater or meeting): 0 As a passenger in a car for an hour without a break: 2 Lying down to rest in the afternoon: 3 Sitting and talking to someone: 0 Sitting quietly after lunch (no alcohol): 1 In a car, while stopped in traffic: 0 Total: 10  FSS was 16

## 2018-12-31 NOTE — Progress Notes (Addendum)
[facility]    Provider:  Dr Jaynee Eagles Referring Provider: Reynold Bowen, MD Primary Care Provider:  Reynold Bowen, MD  CC:  Migraine  HPI:  Taylor Alvarado is a 63 y.o. female here as requested by provider Reynold Bowen, MD for migraines. Migraines started at the age of 37 and then went away for many years. She may have had an occular migraine in her 12s.  Mother and sister with headaches with menses.  She recently had a horrible headache for a month. She has headaches associated with sleep. She wakes up with headaches. She snores heavily. She retired in 2018 and this past fall she took a part time job and she is getting up at 79am maybe that has affected her. Less rest makes it worse.  She snores heavily. She feels her eyes give her a headache, she shops online, if she reads for an extended period of time she will get a headache. Prescription glasses helpes, she just got a new prescription. MRI of the brain and CT were normal in the past. She feels like there is pressure, usually on one side, an ache, continuous pain, became severe in January. Ibuprofen and tylenol would take the edge off of it. Prednisone was given as well which helped. No aura. For 3 years she wakes up at night with palpitations.   Tried: Effexor,  Magnesium, atenolol, imitrex, She is still having migraines, she has tried atenolol, magnesium, amitriptyline, topamax, ca channel blocker (amlodipine)  Reviewed notes, labs and imaging from outside physicians, which showed: Reviewed Dr. Baldwin Crown notes.  Patient's BMI is 35.7.  Referred for migraines.  When she was last seen by Dr. Forde Dandy December 17, 2018 she had complained of persistent headaches for about 3 weeks and slight episodes of vertigo as well when leaning head back.  She was diagnosed with acute on chronic migraines.  Migraine started when she was 50, typical migraines described as bandlike sensation with worse pain behind the right eye, her headache has  been constant for almost 4 weeks.  Headaches worse in the morning.  Mild vertigo with some pressure in the right ear.  No change photophobia.  Better with sleeping propped up.  No vision changes or neurologic deficits.  Was seen by headache clinic 13 years ago.  Physical neurologic exam normal.  Normal alarming symptoms for her acute migraine.  She was given an 80 mg Depo-Medrol IM and a prednisone taper over 6 days.  An MRI of the brain was recommended.  Also the CGRP's were recommended.  Review of Systems: Patient complains of symptoms per HPI as well as the following symptoms: migraine. Pertinent negatives and positives per HPI. All others negative.   Social History   Socioeconomic History  . Marital status: Married    Spouse name: Not on file  . Number of children: 2  . Years of education: Not on file  . Highest education level: Bachelor's degree (e.g., BA, AB, BS)  Occupational History  . Occupation: tudor part time    Comment: [place]  . Financial resource strain: Not on file  . Food insecurity:    Worry: Not on file    Inability: Not on file  . Transportation needs:    Medical: Not on file    Non-medical: Not on file  Tobacco Use  . Smoking status: Never Smoker  . Smokeless tobacco: Never Used  Substance and Sexual Activity  . Alcohol use: Never    Frequency: Never  .  Drug use: Never  . Sexual activity: Not on file  Lifestyle  . Physical activity:    Days per week: Not on file    Minutes per session: Not on file  . Stress: Not on file  Relationships  . Social connections:    Talks on phone: Not on file    Gets together: Not on file    Attends religious service: Not on file    Active member of club or organization: Not on file    Attends meetings of clubs or organizations: Not on file    Relationship status: Not on file  . Intimate partner violence:    Fear of current or ex partner: Not on file    Emotionally abused: Not on file     Physically abused: Not on file    Forced sexual activity: Not on file  Other Topics Concern  . Not on file  Social History Narrative   Lives at home with husband   Right handed   Caffeine: drinks tea at all meals    Family History  Problem Relation Age of Onset  . Heart disease Father   . Diabetes type II Father   . Heart disease Brother   . Diabetes type I Brother   . Headache Mother        during menstruation  . Headache Sister        during menstruation  . Breast cancer Maternal Grandmother   . Bone cancer Paternal Grandmother   . Colon cancer Neg Hx     Past Medical History:  Diagnosis Date  . Arthritis   . GERD (gastroesophageal reflux disease)   . Hyperlipidemia   . Hypothyroidism   . Migraine    diagnosed at 71, [facility].  . Osteoarthritis     There are no active problems to display for this patient.   Past Surgical History:  Procedure Laterality Date  . [place]  . TONSILLECTOMY  1966  . TUBAL LIGATION  1995    Current Outpatient Medications  Medication Sig Dispense Refill  . ibuprofen (ADVIL) 200 MG tablet Take 200 mg by mouth daily.     . Levothyroxine Sodium (SYNTHROID PO) Take by mouth. Takes 0.088 mg daily    . pantoprazole (PROTONIX) 40 MG tablet Take 40 mg by mouth once a week.     . rosuvastatin (CRESTOR) 10 MG tablet Take 10 mg by mouth once a week.     . rizatriptan (MAXALT-MLT) 10 MG disintegrating tablet Take 1 tablet (10 mg total) by mouth as needed for migraine. May repeat in 2 hours if needed 9 tablet 11   No current facility-administered medications for this visit.     Allergies as of 12/31/2018 - Review Complete 12/31/2018  Allergen Reaction Noted  . Penicillins Itching 06/11/2014  . Septra [sulfamethoxazole-trimethoprim] Hives 06/11/2014  . Shellfish allergy Itching 06/11/2014    Vitals: BP (!) 184/110 Comment: vs rechecked by [facility] RN, manual BP. Pt will call PCP  Pulse 70   Ht 5'  3" (1.6 m)   Wt 198 lb (89.8 kg)   SpO2 95%   BMI 35.07 kg/m  Last Weight:  Wt Readings from Last 1 Encounters:  12/31/18 198 lb (89.8 kg)   Last Height:   Ht Readings from Last 1 Encounters:  12/31/18 5\' 3"  (1.6 m)     Physical exam: Exam: Gen: NAD, conversant  CV: RRR, no MRG. No Carotid Bruits. No peripheral edema, warm, nontender Eyes: Conjunctivae clear without exudates or hemorrhage  Neuro: Detailed Neurologic Exam  Speech:    Speech is normal; fluent and spontaneous with normal comprehension.  Cognition:    The patient is oriented to person, place, and time;     recent and remote memory intact;     language fluent;     normal attention, concentration,     fund of knowledge Cranial Nerves:    The pupils are equal, round, and reactive to light. The fundi are normal and spontaneous venous pulsations are present. Visual fields are full to finger confrontation. Extraocular movements are intact. Trigeminal sensation is intact and the muscles of mastication are normal. The face is symmetric. The palate elevates in the midline. Hearing intact. Voice is normal. Shoulder shrug is normal. The tongue has normal motion without fasciculations.   Coordination:    Normal finger to nose   Gait:    Heel-toe and tandem gait are normal.   Motor Observation:    No asymmetry, no atrophy, and no involuntary movements noted. Tone:    Normal muscle tone.    Posture:    Posture is normal. normal erect    Strength:    Strength is V/V in the upper and lower limbs.      Sensation: intact to LT     Reflex Exam:  DTR's:    Deep tendon reflexes in the upper and lower extremities are normal bilaterally.   Toes:    The toes are downgoing bilaterally.   Clonus:    Clonus is absent.    Assessment/Plan:  63 year old with migraines and morning headaches 2-3x a week. Likely migraines with possible concomitant OSA.  Snoring, morning headaches several times a week, , ESS  10: Sleep evaluation/sleep study Acute: Rizatriptan: Please take one tablet at the onset of your headache. If it does not improve the symptoms please take one additional tablet. Do not take more then 2 tablets in 24hrs. Do not take use more then 2 to 3 times in a week.  Declines MRI, no change in symptoms   Orders Placed This Encounter  Procedures  . Ambulatory referral to Sleep Studies   Meds ordered this encounter  Medications  . rizatriptan (MAXALT-MLT) 10 MG disintegrating tablet    Sig: Take 1 tablet (10 mg total) by mouth as needed for migraine. May repeat in 2 hours if needed    Dispense:  9 tablet    Refill:  11    Cc: Reynold Bowen, MD,    Sarina Ill, MD  [facility] [address] [place], [place] [address]  Phone [phone] Fax [phone]

## 2019-02-11 ENCOUNTER — Encounter: Payer: Self-pay | Admitting: *Deleted

## 2019-02-11 ENCOUNTER — Telehealth: Payer: Self-pay | Admitting: Neurology

## 2019-02-11 NOTE — Telephone Encounter (Signed)
Pt called in and stated the rizatriptan (MAXALT-MLT) 10 MG disintegrating tablet is not working for her headaches, and since she was last seen she has been diagnosed with high blood pressure and she is currently on amlodipine and olmasartan 40mg 

## 2019-02-11 NOTE — Telephone Encounter (Signed)
Due to current COVID 19 pandemic, our office is severely reducing in office visits, in order to minimize the risk to our patients and healthcare providers.  Pt understands that although there may be some limitations with this type of visit, we will take all precautions to reduce any security or privacy concerns.  Pt understands that this will be treated like an in office visit and we will file with pt's insurance, and there may be a patient responsible charge related to this service. Pt's email is [email or web address]. Pt understands that the cisco webex software must be downloaded and operational on the device pt plans to use for the visit. Pt understands that the nurse will be calling to go over pt's chart.

## 2019-02-11 NOTE — Telephone Encounter (Signed)
Chart updated with new bp meds.

## 2019-02-12 ENCOUNTER — Telehealth: Payer: Self-pay | Admitting: Neurology

## 2019-02-12 DIAGNOSIS — G43709 Chronic migraine without aura, not intractable, without status migrainosus: Secondary | ICD-10-CM

## 2019-02-12 MED ORDER — ERENUMAB-AOOE 140 MG/ML ~~LOC~~ SOAJ: 140.0000 mg | SUBCUTANEOUS | 11 refills | Status: DC

## 2019-02-12 NOTE — Telephone Encounter (Signed)
She is still having migraines, she has tried atenolol, magnesium, amitriptyline, start Aimovig. Will call pharmacy and if insurance wants a different one will change.

## 2019-02-12 NOTE — Telephone Encounter (Signed)
Spoke with Taylor Alvarado and advised that Dr. Jaynee Eagles got her Aimovig savings card ready and called pharmacy. We will mail this to her so she has it on hand. Advised Taylor Alvarado to contact pharmacy to get it filled. Also discussed instructions on administering medication, advised there will be detailed instructions with each pen. Also discussed that pens will need to be refrigerated until ready to use then let sit out at room temp for 30 minutes. Taylor Alvarado verbalized appreciation for the call.

## 2019-02-13 ENCOUNTER — Encounter: Payer: Self-pay | Admitting: Neurology

## 2019-02-13 NOTE — Telephone Encounter (Signed)
Called the pt to review her chart with her. We made sure that everything was updated on the chart as well as pharmacy. I advised the patient I will send a e-mail for the pt to complete for the sleep scale and have her reply her answers prior to the apt,

## 2019-02-13 NOTE — Addendum Note (Signed)
Addended by: Darleen Crocker on: 02/13/2019 04:43 PM   Modules accepted: Orders

## 2019-02-16 NOTE — Telephone Encounter (Signed)
error 

## 2019-02-17 NOTE — Telephone Encounter (Addendum)
4/15 savings card was given to Taylor Alvarado to mail to pt.   4/20 received PA for Aimovig 140 mg. Even if this is denied, pt can use savings card.  Cover My Meds KEY: [id number]. Awaiting [facility] determination.

## 2019-02-17 NOTE — Telephone Encounter (Signed)
Aimovig approved 02/17/2019 through 05/19/2019. Faxed approval letter to [employer]. Received a receipt of confirmation.

## 2019-02-20 ENCOUNTER — Other Ambulatory Visit: Payer: Self-pay

## 2019-02-20 ENCOUNTER — Ambulatory Visit (INDEPENDENT_AMBULATORY_CARE_PROVIDER_SITE_OTHER): Payer: BC Managed Care – PPO | Admitting: Neurology

## 2019-02-20 DIAGNOSIS — R519 Headache, unspecified: Secondary | ICD-10-CM | POA: Insufficient documentation

## 2019-02-20 DIAGNOSIS — R0683 Snoring: Secondary | ICD-10-CM | POA: Insufficient documentation

## 2019-02-20 DIAGNOSIS — G43001 Migraine without aura, not intractable, with status migrainosus: Secondary | ICD-10-CM | POA: Diagnosis not present

## 2019-02-20 DIAGNOSIS — R51 Headache: Secondary | ICD-10-CM

## 2019-02-20 DIAGNOSIS — G478 Other sleep disorders: Secondary | ICD-10-CM | POA: Insufficient documentation

## 2019-02-20 DIAGNOSIS — R400 Somnolence: Secondary | ICD-10-CM

## 2019-02-20 DIAGNOSIS — R61 Generalized hyperhidrosis: Secondary | ICD-10-CM

## 2019-02-20 DIAGNOSIS — R002 Palpitations: Secondary | ICD-10-CM | POA: Insufficient documentation

## 2019-02-20 HISTORY — DX: Headache, unspecified: R51.9

## 2019-02-20 HISTORY — DX: Generalized hyperhidrosis: R61

## 2019-02-20 HISTORY — DX: Snoring: R06.83

## 2019-02-20 NOTE — Patient Instructions (Signed)
Sleep Studies A sleep study (polysomnogram) is a series of tests done while you are sleeping. A sleep study records your brain waves, heart rate, breathing rate, oxygen level, and eye and leg movements. A sleep study helps your health care provider:  See how well you sleep.  Diagnose a sleep disorder.  Determine how severe your sleep disorder is.  Create a plan to treat your sleep disorder. Your health care provider may recommend a sleep study if you:  Feel sleepy on most days.  Snore loudly while sleeping.  Have unusual behaviors while you sleep, such as walking.  Have brief periods in which you stop breathing during sleep (sleepapnea).  Fall asleep suddenly during the day (narcolepsy).  Have trouble falling asleep or staying asleep (insomnia).  Feel like you need to move your legs when trying to fall asleep (restless legs syndrome).  Move your legs by flexing and extending them regularly while asleep (periodic limb movement disorder).  Act out your dreams while you sleep (sleep behavior disorder).  Feel like you cannot move when you first wake up (sleep paralysis). What tests are part of a sleep study? Most sleep studies record the following during sleep:  Brain activity.  Eye movements.  Heart rate and rhythm.  Breathing rate and rhythm.  Blood-oxygen level.  Blood pressure.  Chest and belly movement as you breathe.  Arm and leg movements.  Snoring or other noises.  Body position. Where are sleep studies done? Sleep studies are done at sleep centers. A sleep center may be inside a hospital, office, or clinic. The room where you have the study may look like a hospital room or a hotel room. The health care providers doing the study may come in and out of the room during the study. Most of the time, they will be in another room monitoring your test as you sleep. How are sleep studies done? Most sleep studies are done during a normal period of time for a full  night of sleep. You will arrive at the study center in the evening and go home in the morning. Before the test  Bring your pajamas and toothbrush with you to the sleep study.  Do not have caffeine on the day of your sleep study.  Do not drink alcohol on the day of your sleep study.  Your health care provider will let you know if you should stop taking any of your regular medicines before the test. During the test      Round, sticky patches with sensors attached to recording wires (electrodes) are placed on your scalp, face, chest, and limbs.  Wires from all the electrodes and sensors run from your bed to a computer. The wires can be taken off and put back on if you need to get out of bed to go to the bathroom.  A sensor is placed over your nose to measure airflow.  A finger clip is put on your finger or ear to measure your blood oxygen level (pulse oximetry).  A belt is placed around your belly and a belt is placed around your chest to measure breathing movements.  If you have signs of the sleep disorder called sleep apnea during your test, you may get a treatment mask to wear for the second half of the night. ? The mask provides positive airway pressure (PAP) to help you breathe better during sleep. This may greatly improve your sleep apnea. ? You will then have all tests done again with the mask   you may get a treatment mask to wear for the second half of the night.  ? The mask provides positive airway pressure (PAP) to help you breathe better during sleep. This may greatly improve your sleep apnea.  ? You will then have all tests done again with the mask in place to see if your measurements and recordings change.  After the test  · A medical doctor who specializes in sleep will evaluate the results of your sleep study and share them with you and your primary health care provider.  · Based on your results, your medical history, and a physical exam, you may be diagnosed with a sleep disorder, such as:  ? Sleep apnea.  ? Restless legs syndrome.  ? Sleep-related behavior disorder.  ? Sleep-related movement disorders.  ? Sleep-related seizure disorders.  · Your health care team will help determine your treatment options based on your diagnosis. This  may include:  ? Improving your sleep habits (sleep hygiene).  ? Wearing a continuous positive airway pressure (CPAP) or bi-level positive airway pressure (BPAP) mask.  ? Wearing an oral device at night to improve breathing and reduce snoring.  ? Taking medicines.  Follow these instructions at home:  · Take over-the-counter and prescription medicines only as told by your health care provider.  · If you are instructed to use a CPAP or BPAP mask, make sure you use it nightly as directed.  · Make any lifestyle changes that your health care provider recommends.  · If you were given a device to open your airway while you sleep, use it only as told by your health care provider.  · Do not use any tobacco products, such as cigarettes, chewing tobacco, and e-cigarettes. If you need help quitting, ask your health care provider.  · Keep all follow-up visits as told by your health care provider. This is important.  Summary  · A sleep study (polysomnogram) is a series of tests done while you are sleeping. It shows how well you sleep.  · Most sleep studies are done over one full night of sleep. You will arrive at the study center in the evening and go home in the morning.  · If you have signs of the sleep disorder called sleep apnea during your test, you may get a treatment mask to wear for the second half of the night.  · A medical doctor who specializes in sleep will evaluate the results of your sleep study and share them with your primary health care provider.  This information is not intended to replace advice given to you by your health care provider. Make sure you discuss any questions you have with your health care provider.  Document Released: 04/22/2003 Document Revised: 11/13/2017 Document Reviewed: 11/13/2017  Elsevier Interactive Patient Education © 2019 [employer]

## 2019-02-20 NOTE — Progress Notes (Signed)
Virtual Visit via Video Note  I connected with Taylor Alvarado on 02/20/19 at  3:00 PM EDT by a video enabled telemedicine application and verified that I am speaking with the correct person using two identifiers.   I discussed the limitations of evaluation and management by telemedicine and the availability of in person appointments. The patient expressed understanding and agreed to proceed.  Referral from Dr Jaynee Eagles.     SLEEP [facility]INE CLINIC   Provider:  Larey Seat, MD  Primary Care Physician:  Reynold Bowen, MD   Referring Provider: Sarina Ill, MD    HPI:  Taylor Alvarado is a 63 y.o. female , seen on 02-20-2019 in a referral in a VIDEO guided virtual visit.    Chief complaint according to patient : Taylor Alvarado has been an established patient of Dr. Marjory Lies her primary care physician she was referred originally for migraine which began again at the age of 10 after being migraine free  for many years she had ocular migraines in her 45s and 75s.  When she presented to Dr. Lavell Anchors here at [facility] she correlated headaches also with her sleep patterns.  She snores heavily and she wakes up with headaches.  If she shops online on her smart phone she develops a headache, if she reads for an extended.  Of time she will also get a headache.  She has been evaluated by an MRI and CT of the brain which were normal in the past but she states that there was a continuous new pain that became most severe by January 2020 and over-the-counter medications have only taken the edge off.  She had been given a prednisone Dosepak and this has helped.  She reports no visual aura with these headaches but she wakes up at night with palpitations.   Sleep /medical history: Recurrent migraines,not responding to triptans- just started on EMGALITY.  Osteoarthsitis, hypothyroidism,  GERD, Lipidemia, Heart palpitations -often at night-,  HTN- which was only recently discovered.   The patient also has an irregular heartbeat with many PVCs and has known this for years.    She has a family history of early heart disease and is worried that her palpitations may reflect that. The patient advised me that she started on 2 antihypertensive medications just in February 2020, one was on [place] 40 mg in the morning and Norvasc-amlodipine 5 mg at bedtime.  Family medical and sleep history: father with early onset heart disease, CAD.  Her mother and daughter have migraine headaches.  Heart disease diabetic type II affected her father and a brother who actually developed diabetes type 1 at age 12.  Her mother and her sister had headaches during the menstrual cycle, supposed to reflect catamenial migraines.  The maternal grandmother had breast cancer, the  paternal grandmother osteosarcoma.   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  She has 2 adult children.  She never drinks alcohol,  she has never used tobacco in any form, the only form of caffeine intake is NT twice daily twice a day this is hot tea. She retired from [employer] and has a bachelor's degree.  She is married.  Sleep habits are as follows: Dinnertime for the couple is around 7 PM bedtime as late as 11:30 PM the patient retreats to a cool, quiet and dark bedroom she states and she prefers  to sleep on her right side.  But she shares a bedroom with her husband there are no pets in the room, and the couple does not have a TV in the bedroom.  She uses 3 pillows to prop her upper body up- this is in relation to experiencing nocturnal acid reflux.  It seems to also help with headaches she states.  She is usually promptly asleep but during the [place]- crisis has begun worrying and having a little bit more trouble to initiate sleep. She is usually asleep for 4 or 5 hours before she  goes up for the bathroom, and while she returns promptly to bed and will take her now 30 or 45 minutes to continue sleeping.  She has reported intense dreams waking up with heart palpitations.  Some of these may reflect panic responses to what she dreams sometimes it is out of fear sometimes out of excitement these cluster in the morning hours which mostly relates to REM sleep.  Her alarm rings at 4:45 AM when she was working now she sleeps in and spontaneously rises between 7 and 8 AM.  She added her observation that the hours of sleep do not have an effect on her headache.  She does not nap-years ago she felt a power nap of less than 30 minutes was very refreshing, but since retired this is no longer true.   Review of Systems: Out of a complete 14 system review, the patient complains of only the following symptoms, and all other reviewed systems are negative. How likely are you to doze in the following situations: 0 = not likely, 1 = slight chance, 2 = moderate chance, 3 = high chance  Sitting and Reading? Watching Television? Sitting inactive in a public place (theater or meeting)? Lying down in the afternoon when circumstances permit? Sitting and talking to someone? Sitting quietly after lunch without alcohol? In a car, while stopped for a few minutes in traffic? As a passenger in a car for an hour without a break?  Total =16 with Dr Jaynee Eagles, now 9/ 24 since she hasn't been working.   Snoring, nocturnal palpitations ,      Social History   Socioeconomic History  . Marital status: Married    Spouse name: Not on file  . Number of children: 2  . Years of education: Not on file  . Highest education level: Bachelor's degree (e.g., BA, AB, BS)  Occupational History  . Occupation: tudor part time    Comment: [employer]  . Financial resource strain: Not on file  . Food insecurity:    Worry: Not on file    Inability: Not on file  . Transportation needs:     Medical: Not on file    Non-medical: Not on file  Tobacco Use  . Smoking status: Never Smoker  . Smokeless tobacco: Never Used  Substance and Sexual Activity  . Alcohol use: Never    Frequency: Never  . Drug use: Never  . Sexual activity: Not on file  Lifestyle  . Physical activity:    Days per week: Not on file    Minutes per session: Not on file  . Stress: Not on file  Relationships  . Social connections:    Talks on phone: Not on file    Gets together: Not on file    Attends religious service: Not on file    Active member of club or organization: Not on file    Attends meetings  of clubs or organizations: Not on file    Relationship status: Not on file  . Intimate partner violence:    Fear of current or ex partner: Not on file    Emotionally abused: Not on file    Physically abused: Not on file    Forced sexual activity: Not on file  Other Topics Concern  . Not on file  Social History Narrative   Lives at home with husband   Right handed   Caffeine: drinks tea at all meals    Family History  Problem Relation Age of Onset  . Heart disease Father   . Diabetes type II Father   . Heart disease Brother   . Diabetes type I Brother   . Headache Mother        during menstruation  . Headache Sister        during menstruation  . Breast cancer Maternal Grandmother   . Bone cancer Paternal Grandmother   . Colon cancer Neg Hx     Past Medical History:  Diagnosis Date  . Arthritis   . GERD (gastroesophageal reflux disease)   . High blood pressure 2020  . Hyperlipidemia   . Hypothyroidism   . Migraine    diagnosed at 66, [facility].  . Osteoarthritis     Past Surgical History:  Procedure Laterality Date  . [place]  . TONSILLECTOMY  1966  . TUBAL LIGATION  1995    Current Outpatient Medications  Medication Sig Dispense Refill  . amLODipine (NORVASC) 5 MG tablet TK 1 T PO QHS    . Erenumab-aooe (AIMOVIG) 140 MG/ML  SOAJ Inject 140 mg into the skin every 30 (thirty) days. 1 pen 11  . ibuprofen (ADVIL) 200 MG tablet Take 200 mg by mouth daily.     Marland Kitchen olmesartan (BENICAR) 40 MG tablet TK 1 T PO QHS    . pantoprazole (PROTONIX) 40 MG tablet Take 40 mg by mouth 2 (two) times a week.     . RESTASIS 0.05 % ophthalmic emulsion INT 1 GTT IN OU BID    . rizatriptan (MAXALT-MLT) 10 MG disintegrating tablet Take 1 tablet (10 mg total) by mouth as needed for migraine. May repeat in 2 hours if needed 9 tablet 11  . rosuvastatin (CRESTOR) 10 MG tablet Take 10 mg by mouth once a week.     Marland Kitchen SYNTHROID 88 MCG tablet TK 1 T PO QD     No current facility-administered medications for this visit.     Allergies as of 02/20/2019 - Review Complete 02/13/2019  Allergen Reaction Noted  . Penicillins Itching 06/11/2014  . Septra [sulfamethoxazole-trimethoprim] Hives 06/11/2014  . Shellfish allergy Itching 06/11/2014    Vitals: There were no vitals taken for this visit. Last Weight:  Wt Readings from Last 1 Encounters:  12/31/18 198 lb (89.8 kg)   DVV:OHYWV is no height or weight on file to calculate BMI.     Last Height:   Ht Readings from Last 1 Encounters:  12/31/18 5\' 3"  (1.6 m)    Observation.  General: The patient is awake, alert and appears not in acute distress. The patient is well groomed. Head: Normocephalic, atraumatic. Neck is supple. Mallampati 3, status post tonsillectomy. neck circumference:15". Nasal airflow patent , Retrognathia is seen.  Cardiovascular:   Reported palpitations.  Skin:  Without evidence of facial edema, or rash Trunk:  Neurologic exam : The patient is awake and alert, oriented to place and  time.   Memory subjective described as intact.   Attention span & concentration ability appears normal.  Speech is fluent,  without  dysarthria, dysphonia or aphasia.  Mood and affect are appropriate.  Cranial nerves: Pupils are equal - Extraocular movements  in vertical and horizontal planes  intact - Facial motor strength is symmetric and tongue and uvula move midline. Shoulder shrug was observed and symmetrical.   There is normal range of motion for the upper extremities shoulders wrist and fingers.   Rapid alternating movements are not impaired by tremor, dysmetria or ataxia.   The patient reports being mobile without assistive device.   Assessment and Plan:  1)Attended sleep study is recommended, due to REM related arousals, associated with clamminess , palpitations and non restorative sleep. A REM parasomnia monrtage would be the best evaluation.   2)Snoring is also present. Sleep related headaches are a main concern.  She has developed HTN " out of the blue" . All these can be manifestations of sleep apnea, but we decided to hold off on a HST and rather combine the evaluation of both sleep concerns in one study.    Follow Up Instructions: we will tentatively schedule an attended sleep study with expanded montage for Mid- Late May 2020.     I discussed the assessment and treatment plan with the patient. The patient was provided an opportunity to ask questions and all were answered. The patient agreed with the plan and demonstrated an understanding of the instructions.   The patient was advised to call back or seek an in-person evaluation if the symptoms worsen or if the condition fails to improve as anticipated.  I provided 28 minutes of non-face-to-face time during this encounter.   Larey Seat, MD   RV after attended sleep study, probably with MD in circa 2 month.   Larey Seat, MD 0/12/7046, 8:89 PM  Certified in Neurology by ABPN Certified in [place] by [facility] [address] [place], [place] [address]

## 2019-02-28 ENCOUNTER — Encounter: Payer: Self-pay | Admitting: Neurology

## 2019-04-10 ENCOUNTER — Ambulatory Visit (INDEPENDENT_AMBULATORY_CARE_PROVIDER_SITE_OTHER): Payer: BC Managed Care – PPO | Admitting: Neurology

## 2019-04-10 DIAGNOSIS — G43001 Migraine without aura, not intractable, with status migrainosus: Secondary | ICD-10-CM

## 2019-04-10 DIAGNOSIS — G478 Other sleep disorders: Secondary | ICD-10-CM

## 2019-04-10 DIAGNOSIS — R0683 Snoring: Secondary | ICD-10-CM

## 2019-04-10 DIAGNOSIS — G4733 Obstructive sleep apnea (adult) (pediatric): Secondary | ICD-10-CM

## 2019-04-10 DIAGNOSIS — R002 Palpitations: Secondary | ICD-10-CM

## 2019-04-10 DIAGNOSIS — R61 Generalized hyperhidrosis: Secondary | ICD-10-CM

## 2019-04-10 DIAGNOSIS — R519 Headache, unspecified: Secondary | ICD-10-CM

## 2019-04-10 DIAGNOSIS — R400 Somnolence: Secondary | ICD-10-CM

## 2019-04-11 ENCOUNTER — Other Ambulatory Visit: Payer: Self-pay

## 2019-04-18 NOTE — Procedures (Signed)
PATIENT'S NAME:  Taylor Alvarado, Taylor Alvarado DOB:      [birth year]      MR#:    [id number]     DATE OF RECORDING: 04/10/2019 REFERRING M.D.:  Sarina Ill MD Study Performed:    Polysomnogram with Bonnita Levan  Montage  HISTORY:  Kirstie Larsen is a 63 y.o. female patient and seen on 02-20-2019 in a VIDEO guided virtual visit.   Chief complaint according to patient : Mrs. Melaina Howerton has been an established patient of Dr. Marjory Lies . She was referred originally for migraine which began again at the age of 69 after she had been migraine free for many years. She had ocular migraines in her 7s and 20s.  When she presented to Dr. Jaynee Eagles here at [facility] she correlated headaches also with her sleep patterns.  She snores heavily and she wakes up with headaches.  If she shops online on her smart phone she develops a headache, also if she reads for an extended period of time. She had been given a prednisone Dose pack and this has helped.  She reports no visual aura with these headaches but she wakes up at night with palpitations. Recurrent migraines, not responding to triptans- just started on EMGALITY. Osteoarthritis, hypothyroidism, GERD, hyperlipidemia, HTN- which was only recently discovered.  The patient also has an irregular heartbeat with many PVCs and has known this for years.  The patient endorsed the Epworth Sleepiness Scale at 9 points.   The patient's weight 198 pounds with a height of 63 (inches), resulting in a BMI of 35.2 kg/m2. The patient's neck circumference measured 15 inches.  CURRENT MEDICATIONS: Norvasc, Aimovig, Advil, Benicar, Protonix, Maxalt, Crestor, Synthroid   PROCEDURE:  This is a multichannel digital polysomnogram utilizing the Somnostar 11.2 system.  Electrodes and sensors were applied and monitored per AASM Specifications.   EEG, EOG, Chin and Limb EMG, were sampled at 200 Hz.  ECG, Snore and Nasal Pressure, Thermal Airflow, Respiratory Effort, CPAP Flow and  Pressure, Oximetry was sampled at 50 Hz. Digital video and audio were recorded.      BASELINE STUDY: Lights Out was at 23:00 and Lights On at 05:06.  Total recording time (TRT) was 366.5 minutes, with a total sleep time (TST) of 259.5 minutes.   The patient's sleep latency was 105.5 minutes.  REM latency was 123.5 minutes.  The sleep efficiency was 70.8 %.     SLEEP ARCHITECTURE: WASO (Wake after sleep onset) was 57.5 minutes.  There were 27.5 minutes in Stage N1, 92.5 minutes Stage N2, 91.5 minutes Stage N3 and 48 minutes in Stage REM.  The percentage of Stage N1 was 10.6%, Stage N2 was 35.6%, Stage N3 was 35.3% and Stage R (REM sleep) was 18.5%.   RESPIRATORY ANALYSIS:  There were a total of 56 respiratory events:  2 obstructive apneas, and 1 central apnea and 0 mixed apneas with a total of 3 apneas and an apnea index (AI) of 0.7 /hour. There were 53 hypopneas with a hypopnea index of 12.3 /hour.    The total APNEA/HYPOPNEA INDEX (AHI) was 12.9 /hour.  40 events occurred in REM sleep and 32 events in NREM. The REM AHI was 50/hour, versus a non-REM AHI of 4.5. The patient spent 200 minutes of total sleep time in the supine position and 60 minutes in non-supine. The supine AHI was 16.8/h versus a non-supine AHI of 0.0.  OXYGEN SATURATION & C02:  The Wake baseline 02 saturation was 98%, with the lowest being 79%.  Time spent below 89% saturation equaled 199 minutes.  The arousals were noted as: 40 were spontaneous, 0 were associated with PLMs, and 12 were associated with respiratory events. The patient had a total of 0 Periodic Limb Movements.   Audio and video analysis did not show any abnormal or unusual movements, behaviors, phonations or vocalizations.  The patient took bathroom breaks. Snoring was noted. EKG was in keeping with normal sinus rhythm (NSR) but there were frequent PVCs. Post-study, the patient indicated that sleep was the same as usual.   IMPRESSION:  1. Mild Obstructive Sleep Apnea  (OSA) ay AHI of 12.9/h with strong REM dependency- REM AHI was 50/h.  2. Supine sleep contributed significantly to AHI exacerbation at 16.8/h. 3. Total hypoxemia time was 199 minutes. Hypoxia periods were especially prolonged noted during REM sleep.  4. There were no abnormal movements or activities during REM sleep or N3 sleep.   5. Mild Primary Snoring.        EKG with PVCs.  RECOMMENDATIONS:  1. Advise full-night, attended, CPAP titration study to optimize therapy and allow, if needed, for oxygen supplementation. 2.  If we are not able to provide this service within a reasonable time during the coronavirus pandemic, please set up with auto CPAP, heated humidity and mask of patient's choice allowing for a pressure window between 5-15 cm water with 2 cm water EPR.     I certify that I have reviewed the entire raw data recording prior to the issuance of this report in accordance with the Standards of Accreditation of the American Academy of Sleep Medicine (AASM)  Larey Seat, MD 04-18-2019 Diplomat, American Board of Psychiatry and Neurology  Diplomat, American Board of [place] Director, [employer] [facility] [employer]

## 2019-04-18 NOTE — Addendum Note (Signed)
Addended by: Larey Seat on: 04/18/2019 01:20 PM   Modules accepted: Orders

## 2019-04-21 ENCOUNTER — Telehealth: Payer: Self-pay | Admitting: Neurology

## 2019-04-21 NOTE — Telephone Encounter (Signed)
-----   Message from Larey Seat, MD sent at 04/18/2019  1:20 PM EDT ----- IMPRESSION:   Addendum- the patient indicated that sleep had been worse than usual in the lab situation.   1. Mild Obstructive Sleep Apnea (OSA) ay AHI of 12.9/h with  strong REM dependency- REM AHI was 50/h.  2. Supine sleep contributed significantly to AHI exacerbation at  16.8/h.  3. Total hypoxemia time was 199 minutes. Hypoxia periods were  especially prolonged noted during REM sleep.  4. There were no abnormal movements or activities during REM  sleep or N3 sleep.   5. Mild Primary Snoring.    EKG with PVCs.   RECOMMENDATIONS:   1. Advise full-night, attended, CPAP titration study to optimize  therapy and allow, if needed, for oxygen supplementation.  2. If we are not able to provide this service within a  reasonable time during the coronavirus pandemic, please set up  with auto CPAP, heated humidity and mask of patient's choice  allowing for a pressure window between 5-15 cm water with 2 cm  water EPR.    [place] and Sarina Ill, MDs

## 2019-04-21 NOTE — Telephone Encounter (Signed)
I called pt. I advised pt that Dr. Brett Fairy reviewed their sleep study results and found that pt has mild sleep apnea. Dr. Brett Fairy recommends that pt starts auto CPAP. I reviewed PAP compliance expectations with the pt. Pt is agreeable to starting a CPAP. I advised pt that an order will be sent to a DME, [facility], and [facility] will call the pt within about one week after they file with the pt's insurance. [facility] will show the pt how to use the machine, fit for masks, and troubleshoot the CPAP if needed. A follow up appt was made for insurance purposes with Ward Givens, NP on Aug 27,2020 at 11:30 am. Pt verbalized understanding to arrive 15 minutes early and bring their CPAP. A letter with all of this information in it will be mailed to the pt as a reminder. I verified with the pt that the address we have on file is correct. Pt verbalized understanding of results. Pt had no questions at this time but was encouraged to call back if questions arise. I have sent the order to [facility] and have received confirmation that they have received the order.

## 2019-04-28 ENCOUNTER — Other Ambulatory Visit: Payer: Self-pay | Admitting: Neurology

## 2019-04-28 DIAGNOSIS — R61 Generalized hyperhidrosis: Secondary | ICD-10-CM

## 2019-04-28 DIAGNOSIS — R0683 Snoring: Secondary | ICD-10-CM

## 2019-04-28 DIAGNOSIS — R519 Headache, unspecified: Secondary | ICD-10-CM

## 2019-04-28 DIAGNOSIS — R400 Somnolence: Secondary | ICD-10-CM

## 2019-04-28 DIAGNOSIS — G478 Other sleep disorders: Secondary | ICD-10-CM

## 2019-05-12 ENCOUNTER — Telehealth: Payer: Self-pay

## 2019-05-12 ENCOUNTER — Other Ambulatory Visit: Payer: Self-pay | Admitting: Neurology

## 2019-05-12 DIAGNOSIS — R400 Somnolence: Secondary | ICD-10-CM

## 2019-05-12 DIAGNOSIS — R0683 Snoring: Secondary | ICD-10-CM

## 2019-05-12 DIAGNOSIS — R519 Headache, unspecified: Secondary | ICD-10-CM

## 2019-05-12 DIAGNOSIS — R0902 Hypoxemia: Secondary | ICD-10-CM

## 2019-05-12 DIAGNOSIS — G43001 Migraine without aura, not intractable, with status migrainosus: Secondary | ICD-10-CM

## 2019-05-12 DIAGNOSIS — R002 Palpitations: Secondary | ICD-10-CM

## 2019-05-12 DIAGNOSIS — G4733 Obstructive sleep apnea (adult) (pediatric): Secondary | ICD-10-CM

## 2019-05-12 DIAGNOSIS — G478 Other sleep disorders: Secondary | ICD-10-CM

## 2019-05-12 NOTE — Telephone Encounter (Signed)
Spoke with patient regarding her sleeps tudy. Dr. Forde Dandy wanted her to go on oxygen at night instead of cpap. I explained Insurance will not pay because it is associated with apnea. She is going to [facility] tomorrow to get set up on cpap. Can you put in order for overnight oximetry? Maybe she can get it tomorrow. We can send report to Dr. Forde Dandy. thanks

## 2019-05-19 ENCOUNTER — Telehealth: Payer: Self-pay

## 2019-05-19 NOTE — Telephone Encounter (Signed)
PA done for Aimovig on cover my meds.

## 2019-05-20 NOTE — Telephone Encounter (Signed)
PA approve for Aimovig 140mg /ml auto injector fro 05/20/2019 to 7/21/20201. PA number is [id number]. PA was denied but redone again via phone with questions.

## 2019-06-02 ENCOUNTER — Encounter: Payer: Self-pay | Admitting: Internal Medicine

## 2019-06-26 ENCOUNTER — Other Ambulatory Visit: Payer: Self-pay

## 2019-06-26 ENCOUNTER — Ambulatory Visit: Payer: BC Managed Care – PPO | Admitting: Adult Health

## 2019-06-26 ENCOUNTER — Encounter: Payer: Self-pay | Admitting: Adult Health

## 2019-06-26 VITALS — BP 149/85 | HR 63 | Temp 97.1°F | Ht 63.0 in | Wt 199.4 lb

## 2019-06-26 DIAGNOSIS — G4733 Obstructive sleep apnea (adult) (pediatric): Secondary | ICD-10-CM

## 2019-06-26 DIAGNOSIS — Z9989 Dependence on other enabling machines and devices: Secondary | ICD-10-CM

## 2019-06-26 NOTE — Patient Instructions (Signed)
Continue using CPAP nightly and greater than 4 hours each night Supplemental O2 order will be sent to DME company  If your symptoms worsen or you develop new symptoms please let us know.

## 2019-06-26 NOTE — Progress Notes (Signed)
PATIENT: Taylor Alvarado DOB: 22-Nov-1955  REASON FOR VISIT: follow up HISTORY FROM: patient  HISTORY OF PRESENT ILLNESS: Today 06/26/19:  Taylor Alvarado is a 63 year old female with a history of obstructive sleep apnea on CPAP.  She returns today for follow-up.  Her download indicates that she used her machine 29 out of 30 days for compliance of 97%.  She use her machine greater than 4 hours each night.  On average she uses her machine 7 hours and 32 minutes.  Her residual AHI is 0.9 on 5 to 15 cm of water with EPR of 2.  Her leak in the 95th percentile is 15.1.  She states that since using the machine the cardiac episodes that she was having at night have decreased but not completely resolved.  She states that there is been several occasions that she wakes up with mild chest pain and feels as if her heart is racing.  But does notice improved slightly since starting CPAP.  The patient did have overnight pulse oximetry.  Her O2 remained less than 89% for 12 minutes and 37 seconds.  She does qualify for supplemental oxygen.  This has not been started yet as we just got the results from her DME company today.  She returns today for evaluation.  HISTORY (Copied from Dr.Dohmeier's note) Taylor Alvarado has been an established patient of Dr. 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  When she presented to Dr. Lavell Anchors here at [facility] she correlated headaches also with her sleep patterns.  She snores heavily and she wakes up with headaches.  If she shops online on her smart phone she develops a headache, if she reads for an extended.  Of time she will also get a headache.  She has been evaluated by an MRI and CT of the brain which were normal in the past but she states that there was a continuous new pain that became most severe  by January 2020 and over-the-counter medications have only taken the edge off.  She had been given a prednisone Dosepak and this has helped.  She reports no visual aura with these headaches but she wakes up at night with palpitations.  REVIEW OF SYSTEMS: Out of a complete 14 system review of symptoms, the patient complains only of the following symptoms, and all other reviewed systems are negative.  See HPI  ALLERGIES: Allergies  Allergen Reactions  . Penicillins Itching    Throat swells  . Septra [Sulfamethoxazole-Trimethoprim] Hives  . Shellfish Allergy Itching    Tightness in throat    HOME MEDICATIONS: Outpatient Medications Prior to Visit  Medication Sig Dispense Refill  . amLODipine (NORVASC) 5 MG tablet TK 1 T PO QHS    . Erenumab-aooe (AIMOVIG) 140 MG/ML SOAJ Inject 140 mg into the skin every 30 (thirty) days. 1 pen 11  . ibuprofen (ADVIL) 200 MG tablet Take 200 mg by mouth daily.     Marland Kitchen olmesartan (BENICAR) 40 MG tablet TK 1 T PO QHS    . pantoprazole (PROTONIX) 40 MG tablet Take 40 mg by mouth as needed.     . RESTASIS 0.05 % ophthalmic emulsion INT 1 GTT IN OU BID    . rizatriptan (MAXALT-MLT) 10 MG disintegrating tablet Take 1 tablet (10 mg total) by mouth as needed for migraine.  May repeat in 2 hours if needed 9 tablet 11  . rosuvastatin (CRESTOR) 10 MG tablet Take 10 mg by mouth once a week.     Marland Kitchen SYNTHROID 88 MCG tablet TK 1 T PO QD     No facility-administered medications prior to visit.     PAST MEDICAL HISTORY: Past Medical History:  Diagnosis Date  . Arthritis   . GERD (gastroesophageal reflux disease)   . High blood pressure 2020  . Hyperlipidemia   . Hypothyroidism   . Migraine    diagnosed at 67, went to [facility].  . Osteoarthritis     PAST SURGICAL HISTORY: Past Surgical History:  Procedure Laterality Date  . [place]  . TONSILLECTOMY  1966  . TUBAL LIGATION  1995    FAMILY HISTORY: Family History   Problem Relation Age of Onset  . Heart disease Father   . Diabetes type II Father   . Heart disease Brother   . Diabetes type I Brother   . Headache Mother        during menstruation  . Headache Sister        during menstruation  . Breast cancer Maternal Grandmother   . Bone cancer Paternal Grandmother   . Colon cancer Neg Hx     SOCIAL HISTORY: Social History   Socioeconomic History  . Marital status: Married    Spouse name: Not on file  . Number of children: 2  . Years of education: Not on file  . Highest education level: Bachelor's degree (e.g., BA, AB, BS)  Occupational History  . Occupation: tudor part time    Comment: [employer]  . Financial resource strain: Not on file  . Food insecurity    Worry: Not on file    Inability: Not on file  . Transportation needs    Medical: Not on file    Non-medical: Not on file  Tobacco Use  . Smoking status: Never Smoker  . Smokeless tobacco: Never Used  Substance and Sexual Activity  . Alcohol use: Never    Frequency: Never  . Drug use: Never  . Sexual activity: Not on file  Lifestyle  . Physical activity    Days per week: Not on file    Minutes per session: Not on file  . Stress: Not on file  Relationships  . Social Herbalist on phone: Not on file    Gets together: Not on file    Attends religious service: Not on file    Active member of club or organization: Not on file    Attends meetings of clubs or organizations: Not on file    Relationship status: Not on file  . Intimate partner violence    Fear of current or ex partner: Not on file    Emotionally abused: Not on file    Physically abused: Not on file    Forced sexual activity: Not on file  Other Topics Concern  . Not on file  Social History Narrative   Lives at home with husband   Right handed   Caffeine: drinks tea at all meals      PHYSICAL EXAM  Vitals:   06/26/19 1112  BP: (!) 149/85  Pulse: 63  Temp:  (!) 97.1 F (36.2 C)  TempSrc: Oral  Weight: 199 lb 6.4 oz (90.4 kg)  Height: 5\' 3"  (1.6 m)   Body mass index is 35.32 kg/m.  Generalized:  Well developed, in no acute distress  Chest: Lungs clear to auscultation bilaterally  Neurological examination  Mentation: Alert oriented to time, place, history taking. Follows all commands speech and language fluent Cranial nerve II-XII: Pupils were equal round reactive to light. Extraocular movements were full, visual field were full on confrontational test. Facial sensation and strength were normal. Uvula tongue midline. Head turning and shoulder shrug  were normal and symmetric. Motor: The motor testing reveals 5 over 5 strength of all 4 extremities. Good symmetric motor tone is noted throughout.  Sensory: Sensory testing is intact to soft touch on all 4 extremities. No evidence of extinction is noted.  Coordination: Cerebellar testing reveals good finger-nose-finger and heel-to-shin bilaterally.  Gait and station: Gait is normal.   DIAGNOSTIC DATA (LABS, IMAGING, TESTING) - I reviewed patient records, labs, notes, testing and imaging myself where available.    ASSESSMENT AND PLAN 63 y.o. year old female  has a past medical history of Arthritis, GERD (gastroesophageal reflux disease), High blood pressure (2020), Hyperlipidemia, Hypothyroidism, Migraine, and Osteoarthritis. here with:  1.  Obstructive sleep apnea on CPAP 2.  Hypoxemia  The patient CPAP download shows excellent compliance and good treatment of her apnea.  She is encouraged to continue using the CPAP nightly.  I will send an order to her DME company to have 2 L of O2 bled into her CPAP at night.  She is advised that if she continues having palpitations during the night she should let us know.  Hopefully with consistent use of CPAP these will resolve.  However if they continue she may need to be evaluated by cardiology.  She is advised that if her symptoms worsen or she develops  new symptoms she should let us know.  She will follow-up in 6 months or sooner if needed.     Ward Givens, MSN, NP-C 06/26/2019, 3:28 PM [facility] [address] [place], [place] [address] [phone]

## 2019-07-02 ENCOUNTER — Telehealth: Payer: Self-pay | Admitting: Neurology

## 2019-07-02 DIAGNOSIS — G43001 Migraine without aura, not intractable, with status migrainosus: Secondary | ICD-10-CM

## 2019-07-02 DIAGNOSIS — G478 Other sleep disorders: Secondary | ICD-10-CM

## 2019-07-02 DIAGNOSIS — R002 Palpitations: Secondary | ICD-10-CM

## 2019-07-02 DIAGNOSIS — G4733 Obstructive sleep apnea (adult) (pediatric): Secondary | ICD-10-CM

## 2019-07-02 DIAGNOSIS — R61 Generalized hyperhidrosis: Secondary | ICD-10-CM

## 2019-07-02 NOTE — Telephone Encounter (Signed)
Mrs Lura pulse-oximetry revealed more than 5 minutes of consecutive hypoxemia, and she qualifies for oxygen supplementation. The study was done while on CPAP in room air.  5 hours and 19 minutes of recorded time.  Larey Seat, MD

## 2019-07-03 NOTE — Telephone Encounter (Signed)
Christia Reading, RN; Charmian Muff        Thank you - we will begin processing.   Christina   Previous Messages  ----- Message -----  From: Marval Regal, RN  Sent: 07/02/2019  3:56 PM EDT  To: Mady Gemma   New order for louberta heldreth 10/23/1956 mrn [id number]   Orders for 2 liters of oxygen by CPAP in epic

## 2019-07-03 NOTE — Progress Notes (Signed)
[facility] received cpap orders.  

## 2019-12-28 ENCOUNTER — Other Ambulatory Visit: Payer: Self-pay

## 2019-12-28 ENCOUNTER — Ambulatory Visit: Payer: BC Managed Care – PPO | Attending: Internal Medicine

## 2019-12-28 DIAGNOSIS — Z23 Encounter for immunization: Secondary | ICD-10-CM

## 2019-12-28 NOTE — Progress Notes (Signed)
   Covid-19 Vaccination Clinic  Name:  Taylor Alvarado    MRN: [id number] DOB: 05-29-1956  12/28/2019  [name] was observed post Covid-19 immunization for 15 minutes without incidence. She was provided with Vaccine Information Sheet and instruction to access the V-Safe system.   [name] was instructed to call 911 with any severe reactions post vaccine: Marland Kitchen Difficulty breathing  . Swelling of your face and throat  . A fast heartbeat  . A bad rash all over your body  . Dizziness and weakness    Immunizations Administered    Name Date Dose VIS Date Route   Pfizer COVID-19 Vaccine 12/28/2019  8:32 AM 0.3 mL 10/10/2019 Intramuscular   Manufacturer: [place]   Lot: [id number]   [place]: [id number]

## 2019-12-30 ENCOUNTER — Ambulatory Visit: Payer: BC Managed Care – PPO | Admitting: Adult Health

## 2020-01-21 ENCOUNTER — Ambulatory Visit: Payer: BC Managed Care – PPO | Attending: Internal Medicine

## 2020-01-21 DIAGNOSIS — Z23 Encounter for immunization: Secondary | ICD-10-CM

## 2020-01-21 NOTE — Progress Notes (Signed)
   Covid-19 Vaccination Clinic  Name:  Taylor Alvarado    MRN: [id number] DOB: Apr 19, 1956  01/21/2020  [name] was observed post Covid-19 immunization for 15 minutes without incident. She was provided with Vaccine Information Sheet and instruction to access the V-Safe system.   [name] was instructed to call 911 with any severe reactions post vaccine: Marland Kitchen Difficulty breathing  . Swelling of face and throat  . A fast heartbeat  . A bad rash all over body  . Dizziness and weakness   Immunizations Administered    Name Date Dose VIS Date Route   Pfizer COVID-19 Vaccine 01/21/2020  9:21 AM 0.3 mL 10/10/2019 Intramuscular   Manufacturer: [place]   Lot: [id number]   [place]: [id number]

## 2020-02-11 ENCOUNTER — Other Ambulatory Visit: Payer: Self-pay

## 2020-02-11 ENCOUNTER — Ambulatory Visit: Payer: Medicare PPO | Admitting: Adult Health

## 2020-02-11 ENCOUNTER — Encounter: Payer: Self-pay | Admitting: Adult Health

## 2020-02-11 VITALS — BP 122/84 | HR 72 | Temp 97.7°F | Ht 63.0 in | Wt 195.0 lb

## 2020-02-11 DIAGNOSIS — G4734 Idiopathic sleep related nonobstructive alveolar hypoventilation: Secondary | ICD-10-CM

## 2020-02-11 DIAGNOSIS — Z9989 Dependence on other enabling machines and devices: Secondary | ICD-10-CM | POA: Diagnosis not present

## 2020-02-11 DIAGNOSIS — G4733 Obstructive sleep apnea (adult) (pediatric): Secondary | ICD-10-CM | POA: Diagnosis not present

## 2020-02-11 NOTE — Progress Notes (Signed)
PATIENT: Taylor Alvarado DOB: [birth year]  REASON FOR VISIT: follow up HISTORY FROM: patient  HISTORY OF PRESENT ILLNESS: Today 02/11/20:  Taylor Alvarado is a 64 year old female with a history of obstructive sleep apnea on CPAP.  She returns today for follow-up.  Her download indicates that she used her machine nightly for compliance of 100%.  She used her machine greater than 4 hours each night.  On average she uses her machine 7 hours and 52 minutes.  Her residual AHI is 0.9 on 5 to 15 cm of water with EPR 2.  Leak in the 95th percentile is 20.8 L/min.  She has nocturnal O2 however due to her insurance change and she now needs a CPAP titration in order to have this approved through her insurance.  Patient reports that her headaches have improved.  No longer having cluster headaches.  She reports that she continues to have palpitations during the night.  She has not had a formal cardiac work-up.  HISTORY 06/26/19:  Taylor Alvarado is a 64 year old female with a history of obstructive sleep apnea on CPAP.  She returns today for follow-up.  Her download indicates that she used her machine 29 out of 30 days for compliance of 97%.  She use her machine greater than 4 hours each night.  On average she uses her machine 7 hours and 32 minutes.  Her residual AHI is 0.9 on 5 to 15 cm of water with EPR of 2.  Her leak in the 95th percentile is 15.1.  She states that since using the machine the cardiac episodes that she was having at night have decreased but not completely resolved.  She states that there is been several occasions that she wakes up with mild chest pain and feels as if her heart is racing.  But does notice improved slightly since starting CPAP.  The patient did have overnight pulse oximetry.  Her O2 remained less than 89% for 12 minutes and 37 seconds.  She does qualify for supplemental oxygen.  This has not been started yet as we just got the results from her DME company today.  She returns today  for evaluation.  REVIEW OF SYSTEMS: Out of a complete 14 system review of symptoms, the patient complains only of the following symptoms, and all other reviewed systems are negative.  [id number] ESS 7  ALLERGIES: Allergies  Allergen Reactions  . Penicillins Itching    Throat swells  . Septra [Sulfamethoxazole-Trimethoprim] Hives  . Shellfish Allergy Itching    Tightness in throat    HOME MEDICATIONS: Outpatient Medications Prior to Visit  Medication Sig Dispense Refill  . amLODipine (NORVASC) 5 MG tablet TK 1 T PO QHS    . Erenumab-aooe (AIMOVIG) 140 MG/ML SOAJ Inject 140 mg into the skin every 30 (thirty) days. 1 pen 11  . ibuprofen (ADVIL) 200 MG tablet Take 200 mg by mouth daily.     Marland Kitchen olmesartan (BENICAR) 40 MG tablet TK 1 T PO QHS    . pantoprazole (PROTONIX) 40 MG tablet Take 40 mg by mouth as needed.     . RESTASIS 0.05 % ophthalmic emulsion INT 1 GTT IN OU BID    . rizatriptan (MAXALT-MLT) 10 MG disintegrating tablet Take 1 tablet (10 mg total) by mouth as needed for migraine. May repeat in 2 hours if needed 9 tablet 11  . rosuvastatin (CRESTOR) 10 MG tablet Take 10 mg by mouth once a week.     Marland Kitchen SYNTHROID 88 MCG tablet TK  1 T PO QD     No facility-administered medications prior to visit.    PAST MEDICAL HISTORY: Past Medical History:  Diagnosis Date  . Arthritis   . GERD (gastroesophageal reflux disease)   . High blood pressure 2020  . Hyperlipidemia   . Hypothyroidism   . Migraine    diagnosed at 56, went to [facility].  . Osteoarthritis     PAST SURGICAL HISTORY: Past Surgical History:  Procedure Laterality Date  . [place]  . TONSILLECTOMY  1966  . TUBAL LIGATION  1995    FAMILY HISTORY: Family History  Problem Relation Age of Onset  . Heart disease Father   . Diabetes type II Father   . Heart disease Brother   . Diabetes type I Brother   . Headache Mother        during menstruation  . Headache Sister         during menstruation  . Breast cancer Maternal Grandmother   . Bone cancer Paternal Grandmother   . Colon cancer Neg Hx     SOCIAL HISTORY: Social History   Socioeconomic History  . Marital status: Married    Spouse name: Not on file  . Number of children: 2  . Years of education: Not on file  . Highest education level: Bachelor's degree (e.g., BA, AB, BS)  Occupational History  . 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 Smoking status: Never Smoker  . Smokeless tobacco: Never Used  Substance and Sexual Activity  . Alcohol use: Never  . Drug use: Never  . Sexual activity: Not on file  Other Topics Concern  . Not on file  Social History Narrative   Lives at home with husband   Right handed   Caffeine: drinks tea at all meals   Social Determinants of Health   Financial Resource Strain:   . Difficulty of Paying Living Expenses:   Food Insecurity:   . Worried About Charity fundraiser in the Last Year:   . Arboriculturist in the Last Year:   Transportation Needs:   . Film/video editor (Medical):   Marland Kitchen Lack of Transportation (Non-Medical):   Physical Activity:   . Days of Exercise per Week:   . Minutes of Exercise per Session:   Stress:   . Feeling of Stress :   Social Connections:   . Frequency of Communication with Friends and Family:   . Frequency of Social Gatherings with Friends and Family:   . Attends Religious Services:   . Active Member of Clubs or Organizations:   . Attends Archivist Meetings:   Marland Kitchen Marital Status:   Intimate Partner Violence:   . Fear of Current or Ex-Partner:   . Emotionally Abused:   Marland Kitchen Physically Abused:   . Sexually Abused:       PHYSICAL EXAM  There were no vitals filed for this visit. There is no height or weight on file to calculate BMI.  Generalized: Well developed, in no acute distress  Chest: Lungs clear to auscultation bilaterally  Neurological examination    Mentation: Alert oriented to time, place, history taking. Follows all commands speech and language fluent Cranial nerve II-XII: Extraocular movements were full, visual field were full on confrontational test Head turning and shoulder shrug  were normal and symmetric. Motor: The motor testing reveals 5 over 5 strength of all 4 extremities. Good symmetric  motor tone is noted throughout.  Sensory: Sensory testing is intact to soft touch on all 4 extremities. No evidence of extinction is noted.  Gait and station: Gait is normal.    DIAGNOSTIC DATA (LABS, IMAGING, TESTING) - I reviewed patient records, labs, notes, testing and imaging myself where available.  No results found for: WBC, HGB, HCT, MCV, PLT No results found for: NA, K, CL, CO2, GLUCOSE, BUN, CREATININE, CALCIUM, PROT, ALBUMIN, AST, ALT, ALKPHOS, BILITOT, GFRNONAA, GFRAA No results found for: CHOL, HDL, LDLCALC, LDLDIRECT, TRIG, CHOLHDL No results found for: HGBA1C No results found for: VITAMINB12 No results found for: TSH    ASSESSMENT AND PLAN 63 y.o. year old female  has a past medical history of Arthritis, GERD (gastroesophageal reflux disease), High blood pressure (2020), Hyperlipidemia, Hypothyroidism, Migraine, and Osteoarthritis. here with:  1. OSA on CPAP  - CPAP compliance excellent - Good treatment of AHI  -CPAP titration ordered to evaluate for nocturnal hypoxemia.  Overnight pulse oximetry did show hypoxemia and the patient has been on oxygen however due to insurance change a CPAP titration is required -Continue palpitations during the night advised to follow-up with PCP - encourage patient to use CPAP nightly and > 4 hours each night - F/U in 6 months or sooner if needed   I spent 25 minutes of face-to-face and non-face-to-face time with patient.  This included previsit chart review, lab review, study review, order entry, electronic health record documentation, patient education.  Ward Givens, MSN, NP-C  02/11/2020, 1:00 PM [facility] [address] [place], [place] [address] [phone]

## 2020-02-11 NOTE — Patient Instructions (Signed)
Your Plan:  Continue cpap CPAP titration ordered Order sent for new supplies     Thank you for coming to see [place] at [facility]. I hope we have been able to provide you high quality care today.  You may receive a patient satisfaction survey over the next few weeks. We would appreciate your feedback and comments so that we may continue to improve ourselves and the health of our patients.

## 2020-02-12 ENCOUNTER — Other Ambulatory Visit: Payer: Self-pay | Admitting: Neurology

## 2020-02-12 DIAGNOSIS — Z9989 Dependence on other enabling machines and devices: Secondary | ICD-10-CM

## 2020-02-12 DIAGNOSIS — G4733 Obstructive sleep apnea (adult) (pediatric): Secondary | ICD-10-CM

## 2020-02-12 NOTE — Progress Notes (Signed)
Community message sent to [facility]. 

## 2020-02-19 ENCOUNTER — Other Ambulatory Visit (HOSPITAL_COMMUNITY)
Admission: RE | Admit: 2020-02-19 | Discharge: 2020-02-19 | Disposition: A | Discharge status: Home / Self Care | Payer: Medicare PPO | Source: Ambulatory Visit | Attending: Neurology | Admitting: Neurology

## 2020-02-19 DIAGNOSIS — Z20822 Contact with and (suspected) exposure to covid-19: Secondary | ICD-10-CM | POA: Insufficient documentation

## 2020-02-19 DIAGNOSIS — Z01812 Encounter for preprocedural laboratory examination: Secondary | ICD-10-CM | POA: Diagnosis present

## 2020-02-19 LAB — SARS CORONAVIRUS 2 (TAT 6-24 HRS): SARS Coronavirus 2: NEGATIVE

## 2020-02-22 ENCOUNTER — Ambulatory Visit (INDEPENDENT_AMBULATORY_CARE_PROVIDER_SITE_OTHER): Payer: Medicare PPO | Admitting: Neurology

## 2020-02-22 DIAGNOSIS — Z9989 Dependence on other enabling machines and devices: Secondary | ICD-10-CM

## 2020-02-22 DIAGNOSIS — G43001 Migraine without aura, not intractable, with status migrainosus: Secondary | ICD-10-CM

## 2020-02-22 DIAGNOSIS — Z9981 Dependence on supplemental oxygen: Secondary | ICD-10-CM

## 2020-02-22 DIAGNOSIS — G4733 Obstructive sleep apnea (adult) (pediatric): Secondary | ICD-10-CM | POA: Diagnosis not present

## 2020-02-22 DIAGNOSIS — G4734 Idiopathic sleep related nonobstructive alveolar hypoventilation: Secondary | ICD-10-CM

## 2020-02-22 DIAGNOSIS — R002 Palpitations: Secondary | ICD-10-CM

## 2020-03-01 ENCOUNTER — Encounter: Payer: Self-pay | Admitting: Neurology

## 2020-03-01 DIAGNOSIS — Z9989 Dependence on other enabling machines and devices: Secondary | ICD-10-CM | POA: Insufficient documentation

## 2020-03-01 DIAGNOSIS — Z9981 Dependence on supplemental oxygen: Secondary | ICD-10-CM

## 2020-03-01 DIAGNOSIS — G4734 Idiopathic sleep related nonobstructive alveolar hypoventilation: Secondary | ICD-10-CM | POA: Insufficient documentation

## 2020-03-01 DIAGNOSIS — G4733 Obstructive sleep apnea (adult) (pediatric): Secondary | ICD-10-CM

## 2020-03-01 HISTORY — DX: Dependence on other enabling machines and devices: Z99.89

## 2020-03-01 HISTORY — DX: Obstructive sleep apnea (adult) (pediatric): G47.33

## 2020-03-01 HISTORY — DX: Idiopathic sleep related nonobstructive alveolar hypoventilation: G47.34

## 2020-03-01 HISTORY — DX: Dependence on supplemental oxygen: Z99.81

## 2020-03-01 NOTE — Procedures (Signed)
PATIENT'S NAME:  Taylor Alvarado, Taylor Alvarado DOB:      August 31, 1956      MR#:    [id number]     DATE OF RECORDING: 02/22/2020 AL REFERRING M.D.:  Sarina Ill, MD and Ward Givens, NP  Study Performed:   Titration to positive airway pressure  HISTORY:  Taylor Alvarado is returning for PAP titration following her previously performed PSG from 04/10/19 which resulted in an AHI of 12.9/h, REM sleep AHI of 50/h , prolonged hypoxemia of 199 minutes (!) and SpO2 Nadir of 79%. The patient endorsed the Epworth Sleepiness Scale at 9 points.   The patient's weight 198 pounds with a height of 63 (inches), resulting in a BMI of 35.2 kg/m2. The patient's neck circumference measured 15 inches.  CURRENT MEDICATIONS: Norvasc, Aimovig, Advil, Benicar, Protonix, Maxalt, Crestor, Synthroid  PROCEDURE:  This is a multichannel digital polysomnogram utilizing the SomnoStar 11.2 system.  Electrodes and sensors were applied and monitored per AASM Specifications.   EEG, EOG, Chin and Limb EMG, were sampled at 200 Hz.  ECG, Snore and Nasal Pressure, Thermal Airflow, Respiratory Effort, CPAP Flow and Pressure, Oximetry was sampled at 50 Hz. Digital video and audio were recorded.      CPAP was initiated at 5 cmH20 with heated humidity per AASM split night standards and pressure was gradually advanced to 8 cmH20 because of hypopneas, apneas and desaturations. The nadir rose to 91%.  At a PAP pressure of 8 cmH20, there was a reduction of the AHI to 0.0/h with improvement of sleep apnea. The patient was fitted with a ResMed AirFit F30 small sized mask.  Lights Out was at 22:36 and Lights On at 04:59. Total recording time (TRT) was 383.5 minutes, with a total sleep time (TST) of 250.5 minutes. The patient's sleep latency was 64.5 minutes. REM latency was 110 minutes.  The sleep efficiency was 65.3 %.    SLEEP ARCHITECTURE: WASO (Wake after sleep onset) was 67 minutes.  There were 19 minutes in Stage N1, 86 minutes Stage N2, 102.5 minutes Stage  N3 and 43 minutes in Stage REM.  The percentage of Stage N1 was 7.6%, Stage N2 was 34.3%, Stage N3 was 40.9% and Stage R (REM sleep) was 17.2%.   RESPIRATORY ANALYSIS:  There was a total of 0 respiratory events: 0 obstructive apneas, 0 central apneas and 0 mixed apneas with a total of 0 apneas and an apnea index (AI) of 0 /hour. There were 0 hypopneas with a hypopnea index of 0/hour. The patient also had 0 respiratory event related arousals (RERAs).     The total APNEA/HYPOPNEA INDEX  (AHI) was 0 /hour. The patient spent 158 minutes of total sleep time in the supine position and 93 minutes in non-supine. The supine AHI was 0.0, versus a non-supine AHI of 0.0.  OXYGEN SATURATION & C02:  The baseline 02 saturation was 92%, with the lowest being 90%. Time spent below 89% saturation equaled 0 minutes. The arousals were noted as: 33 were spontaneous, 0 were associated with PLMs, 0 were associated with respiratory events.The patient had a total of 0 Periodic Limb Movements.   Audio and video analysis did not show any abnormal or unusual movements, behaviors, phonations or vocalizations.  No bathroom breaks. EKG was in keeping with normal sinus rhythm (NSR) and frequent PVCs.  DIAGNOSIS: Obstructive Sleep Apnea and sleep hypoxemia were under excellent control with a pressure of 8 cm CPAP, heated humidity and a ResMed AirFit F30 small mask.  PLANS/RECOMMENDATIONS: I will not  need to order a new machine as the patient has a fairly new model at home, already offering 5-15 cm water.  Her high air leak was corrected under the new mask. The patient reportedly uses oxygen at home. This study indicates that there is no need for added oxygen.     DISCUSSION: A follow up appointment will be scheduled with NP in the Sleep Clinic at [facility].   Please call [phone] with any questions.      I certify that I have reviewed the entire raw data recording prior to the issuance of this report in  accordance with the Standards of Accreditation of the American Academy of Sleep Medicine (AASM)   Larey Seat, M.D. Diplomat, Tax adviser of Psychiatry and Neurology  Diplomat, Tax adviser of Sleep Medicine Market researcher, [employer] [facility] at [employer]    DIAGNOSIS: Obstructive Sleep Apnea and sleep hypoxemia were under excellent control with a pressure of 8 cm CPAP, heated humidity and a ResMed AirFit F30 small mask.  PLANS/RECOMMENDATIONS: I will not need to order a new machine as the patient has a fairly new model at home, already offering 5-15 cm water.  Her high air leak was corrected under the new mask. The patient reportedly uses oxygen at home. This study indicates that there is no need for added oxygen.     DISCUSSION: A follow up appointment will be scheduled with NP in the Sleep Clinic at [facility].   Please call [phone] with any questions.

## 2020-03-01 NOTE — Progress Notes (Signed)
DIAGNOSIS: Obstructive Sleep Apnea and sleep hypoxemia were under  excellent control with a pressure of 8 cm CPAP, heated humidity  and a ResMed AirFit F30 small mask.   PLANS/RECOMMENDATIONS: I will not need to order a new machine as  the patient has a fairly new model at home, already offering 5-15  cm water.  Her high air leak was corrected under the new mask. The patient  reportedly uses oxygen at home. This study indicates that there  is no need for added oxygen.    DISCUSSION: A follow up appointment will be scheduled with NP in  the Sleep Clinic at [facility].  Please call  [phone] with any questions.

## 2020-03-02 ENCOUNTER — Telehealth: Payer: Self-pay | Admitting: Neurology

## 2020-03-02 DIAGNOSIS — Z9989 Dependence on other enabling machines and devices: Secondary | ICD-10-CM

## 2020-03-02 DIAGNOSIS — R400 Somnolence: Secondary | ICD-10-CM

## 2020-03-02 DIAGNOSIS — G478 Other sleep disorders: Secondary | ICD-10-CM

## 2020-03-02 NOTE — Telephone Encounter (Signed)
-----   Message from Larey Seat, MD sent at 03/01/2020  5:45 PM EDT ----- DIAGNOSIS: Obstructive Sleep Apnea and sleep hypoxemia were under  excellent control with a pressure of 8 cm CPAP, heated humidity  and a ResMed AirFit F30 small mask.   PLANS/RECOMMENDATIONS: I will not need to order a new machine as  the patient has a fairly new model at home, already offering 5-15  cm water.  Her high air leak was corrected under the new mask. The patient  reportedly uses oxygen at home. This study indicates that there  is no need for added oxygen.    DISCUSSION: A follow up appointment will be scheduled with NP in  the Sleep Clinic at [facility].  Please call  [phone] with any questions.

## 2020-03-02 NOTE — Telephone Encounter (Signed)
-----   Message from Larey Seat, MD sent at 03/01/2020  5:45 PM EDT ----- DIAGNOSIS: Obstructive Sleep Apnea and sleep hypoxemia were under  excellent control with a pressure of 8 cm CPAP, heated humidity  and a ResMed AirFit F30 small mask.   PLANS/RECOMMENDATIONS: I will not need to order a new machine as  the patient has a fairly new model at home, already offering 5-15  cm water.  Her high air leak was corrected under the new mask. The patient  reportedly uses oxygen at home. This study indicates that there  is no need for added oxygen.    DISCUSSION: A follow up appointment will be scheduled with NP in  the Sleep Clinic at [facility].  Please call  [phone] with any questions.

## 2020-03-02 NOTE — Telephone Encounter (Signed)
Called the patient and reviewed with the patient that the purpose of this study was to see if patient was qualified for insurance for oxygen. Per the titration study the patient's apnea as well as oxygen level was under great control at the CPAP pressure of 8 cm water pressure. Advised that based off this information oxygen is no longer needed for the patient. Informed I would send order to [place] the oxygen and [employer] will pick that up for the patient. Patient also wanted to add to order she was in need of supplies. Informed her that I would do so. She was appreciative for the call back and had no further questions.

## 2020-03-08 ENCOUNTER — Ambulatory Visit: Payer: Self-pay | Admitting: Cardiology

## 2020-03-11 ENCOUNTER — Ambulatory Visit: Payer: Self-pay | Admitting: Cardiology

## 2020-03-15 ENCOUNTER — Other Ambulatory Visit: Payer: Self-pay

## 2020-03-15 ENCOUNTER — Ambulatory Visit: Payer: Medicare PPO | Admitting: Cardiology

## 2020-03-15 VITALS — BP 126/82 | HR 90 | Ht 63.0 in | Wt 200.4 lb

## 2020-03-15 DIAGNOSIS — G4733 Obstructive sleep apnea (adult) (pediatric): Secondary | ICD-10-CM | POA: Diagnosis not present

## 2020-03-15 DIAGNOSIS — R011 Cardiac murmur, unspecified: Secondary | ICD-10-CM

## 2020-03-15 DIAGNOSIS — R002 Palpitations: Secondary | ICD-10-CM | POA: Diagnosis not present

## 2020-03-15 DIAGNOSIS — R072 Precordial pain: Secondary | ICD-10-CM | POA: Diagnosis not present

## 2020-03-15 NOTE — Progress Notes (Signed)
Cardiology Consult Note    Date:  03/16/2020   ID:  Taylor Alvarado, DOB 04/22/1956, MRN [id number]  PCP:  Taylor Bowen, MD  Cardiologist:  Fransico Him, MD   Chief Complaint  Patient presents with  . New Patient (Initial Visit)    Palpitations    History of Present Illness:  Taylor Alvarado is a 64 y.o. female who is being seen today for the evaluation of Palpitations at the request of Taylor Alvarado, Annie Main, MD.  This is a 65yo female with a hx of GERD, HLD, hypothyroidism, OSA on PAP who recently saw her PCP complaining of palpitations.  She recently has had problems with palpitations that awaken her from sleep at night.  These only occur when she is in bed at night.  They are associated with severe midsternal chest pain with radiation into her left arm.  The pain feels like a pressure with no associated SOB, Nausea or diaphoresis and usually lasts anywhere from 5 to 92min.  She has had these symptoms for over 5 years but recently they have become more frequent.  In January she started having Migraine HAs and saw Neuro and was dx with cluster HAs.  She underwent Sleep study showing mild OSA and is on CPAP.  Since then her HAs have improved.  She tells me that she is now having palpitations 2-3x's weekly.  She admits to drinking 16ox of tea a day.  She had a cath 16 years ago that was normal.  She has no hx of tobacco use but dose have a fm hx of CAD and her Dad died of CAD.  She denies any SOB, DOE, PND, orthopnea, LE edema, dizziness, or syncope. She is compliant with her meds and is tolerating meds with no SE.    Past Medical History:  Diagnosis Date  . Arthritis   . Daytime sleepiness 02/20/2019  . GERD (gastroesophageal reflux disease)   . Heart palpitations 02/20/2019  . High blood pressure 2020  . Hyperlipidemia   . Hypothyroidism   . Migraine    diagnosed at 25, [facility]  . Migraine without aura and with status migrainosus, not intractable 12/31/2018  . Morning  headache 02/20/2019  . Non-restorative sleep 02/20/2019  . On home oxygen therapy 03/01/2020  . OSA on CPAP 03/01/2020  . Osteoarthritis   . Sleep-related hypoxia 03/01/2020  . Snoring 02/20/2019  . Unexplained night sweats 02/20/2019    Past Surgical History:  Procedure Laterality Date  . [place]  . TONSILLECTOMY  1966  . TUBAL LIGATION  1995    Current Medications: Current Meds  Medication Sig  . amLODipine (NORVASC) 5 MG tablet TK 1 T PO QHS  . ibuprofen (ADVIL) 200 MG tablet Take 200 mg by mouth daily.   Marland Kitchen olmesartan (BENICAR) 40 MG tablet TK 1 T PO QHS  . RESTASIS 0.05 % ophthalmic emulsion INT 1 GTT IN OU BID  . rosuvastatin (CRESTOR) 10 MG tablet Take 10 mg by mouth once a week.   Marland Kitchen SYNTHROID 88 MCG tablet TK 1 T PO QD  . Vitamin D, Cholecalciferol, 25 MCG (1000 UT) TABS Take 1,000 Units by mouth daily.    Allergies:   Penicillins, Septra [sulfamethoxazole-trimethoprim], and Shellfish allergy   Social History   Socioeconomic History  . Marital status: Married    Spouse name: Not on file  . Number of children: 2  . Years of education: Not on file  . Highest education level:  Bachelor's degree (e.g., BA, AB, BS)  Occupational History  . 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 Smoking status: Never Smoker  . Smokeless tobacco: Never Used  Substance and Sexual Activity  . Alcohol use: Never  . Drug use: Never  . Sexual activity: Not on file  Other Topics Concern  . Not on file  Social History Narrative   Lives at home with husband   Right handed   Caffeine: drinks tea at all meals   Social Determinants of Health   Financial Resource Strain:   . Difficulty of Paying Living Expenses:   Food Insecurity:   . Worried About Charity fundraiser in the Last Year:   . Arboriculturist in the Last Year:   Transportation Needs:   . Film/video editor (Medical):   Marland Kitchen Lack of Transportation (Non-Medical):    Physical Activity:   . Days of Exercise per Week:   . Minutes of Exercise per Session:   Stress:   . Feeling of Stress :   Social Connections:   . Frequency of Communication with Friends and Family:   . Frequency of Social Gatherings with Friends and Family:   . Attends Religious Services:   . Active Member of Clubs or Organizations:   . Attends Archivist Meetings:   Marland Kitchen Marital Status:      Family History:  The patient's family history includes Bone cancer in her paternal grandmother; Breast cancer in her maternal grandmother; Diabetes type I in her brother; Diabetes type II in her father; Headache in her mother and sister; Heart disease in her brother and father.   ROS:   Please see the history of present illness.    ROS All other systems reviewed and are negative.  No flowsheet data found.   PHYSICAL EXAM:   VS:  BP 126/82   Pulse 90   Ht 5\' 3"  (1.6 m)   Wt 200 lb 6.4 oz (90.9 kg)   SpO2 98%   BMI 35.50 kg/m    GEN: Well nourished, well developed, in no acute distress  HEENT: normal  Neck: no JVD, carotid bruits, or masses Cardiac: RRR; no rubs, or gallops,no edema.  Intact distal pulses bilaterally. 2/6 SM at RUSB Respiratory:  clear to auscultation bilaterally, normal work of breathing GI: soft, nontender, nondistended, + BS MS: no deformity or atrophy  Skin: warm and dry, no rash Neuro:  Alert and Oriented x 3, Strength and sensation are intact Psych: euthymic mood, full affect  Wt Readings from Last 3 Encounters:  03/15/20 200 lb 6.4 oz (90.9 kg)  02/11/20 195 lb (88.5 kg)  06/26/19 199 lb 6.4 oz (90.4 kg)      Studies/Labs Reviewed:   EKG:  EKG is ordered today.  The ekg ordered today demonstrates NSR with no St changes  Recent Labs: No results found for requested labs within last 8760 hours.   Lipid Panel No results found for: CHOL, TRIG, HDL, CHOLHDL, VLDL, LDLCALC, LDLDIRECT  Additional studies/ records that were reviewed today include:   none    ASSESSMENT:    1. Palpitations   2. OSA (obstructive sleep apnea)   3. Heart murmur   4. Precordial pain      PLAN:  In order of problems listed above:  1.  Palpitations -unclear etiology - they only occur at night -I will get a 30 day event monitor to assess for arrhythmias, particularly  PAF  2.  OSA -continue CPAP -followed by Dr. Brett Fairy  3.  Heart Murmur -check 2D echo  4.  Chest pain -this is atypical in that it only occurs at night and there are no associated symptoms -she does have a fm hx of CAD but later age in life -EKG is normal -I will get an ETT to assess for ischemia    Medication Adjustments/Labs and Tests Ordered: Current medicines are reviewed at length with the patient today.  Concerns regarding medicines are outlined above.  Medication changes, Labs and Tests ordered today are listed in the Patient Instructions below.  Patient Instructions  Medication Instructions:  Your physician recommends that you continue on your current medications as directed. Please refer to the Current Medication list given to you today.  *If you need a refill on your cardiac medications before your next appointment, please call your pharmacy*   Testing/Procedures: Your physician has requested that you have an echocardiogram. Echocardiography is a painless test that uses sound waves to create images of your heart. It provides your doctor with information about the size and shape of your heart and how well your heart's chambers and valves are working. This procedure takes approximately one hour. There are no restrictions for this procedure.  Your physician has requested that you have an exercise tolerance test. For further information please visit [email or web address]. Please also follow instruction sheet, as given.  Your physician has recommended that you wear an event monitor. Event monitors are medical devices that record the heart's electrical activity. Doctors  most often [place] these monitors to diagnose arrhythmias. Arrhythmias are problems with the speed or rhythm of the heartbeat. The monitor is a small, portable device. You can wear one while you do your normal daily activities. This is usually used to diagnose what is causing palpitations/syncope (passing out).  Follow-Up: At [facility], you and your health needs are our priority.  As part of our continuing mission to provide you with exceptional heart care, we have created designated Provider Care Teams.  These Care Teams include your primary Cardiologist (physician) and Advanced Practice Providers (APPs -  Physician Assistants and Nurse Practitioners) who all work together to provide you with the care you need, when you need it.  We recommend signing up for the patient portal called "MyChart".  Sign up information is provided on this After Visit Summary.  MyChart is used to connect with patients for Virtual Visits (Telemedicine).  Patients are able to view lab/test results, encounter notes, upcoming appointments, etc.  Non-urgent messages can be sent to your provider as well.   To learn more about what you can do with MyChart, go to [email or web address].    Follow up with Dr. Radford Pax as needed based on results from testing.        Signed, Fransico Him, MD  03/16/2020 6:48 PM    [facility] [address], [place], [place]  [address] Phone: [phone]; Fax: [phone]

## 2020-03-15 NOTE — Patient Instructions (Signed)
Medication Instructions:  Your physician recommends that you continue on your current medications as directed. Please refer to the Current Medication list given to you today.  *If you need a refill on your cardiac medications before your next appointment, please call your pharmacy*   Testing/Procedures: Your physician has requested that you have an echocardiogram. Echocardiography is a painless test that uses sound waves to create images of your heart. It provides your doctor with information about the size and shape of your heart and how well your heart's chambers and valves are working. This procedure takes approximately one hour. There are no restrictions for this procedure.  Your physician has requested that you have an exercise tolerance test. For further information please visit [email or web address]. Please also follow instruction sheet, as given.  Your physician has recommended that you wear an event monitor. Event monitors are medical devices that record the heart's electrical activity. Doctors most often [place] these monitors to diagnose arrhythmias. Arrhythmias are problems with the speed or rhythm of the heartbeat. The monitor is a small, portable device. You can wear one while you do your normal daily activities. This is usually used to diagnose what is causing palpitations/syncope (passing out).  Follow-Up: At [facility], you and your health needs are our priority.  As part of our continuing mission to provide you with exceptional heart care, we have created designated Provider Care Teams.  These Care Teams include your primary Cardiologist (physician) and Advanced Practice Providers (APPs -  Physician Assistants and Nurse Practitioners) who all work together to provide you with the care you need, when you need it.  We recommend signing up for the patient portal called "MyChart".  Sign up information is provided on this After Visit Summary.  MyChart is used to connect with patients for  Virtual Visits (Telemedicine).  Patients are able to view lab/test results, encounter notes, upcoming appointments, etc.  Non-urgent messages can be sent to your provider as well.   To learn more about what you can do with MyChart, go to [email or web address].    Follow up with Dr. Radford Pax as needed based on results from testing.

## 2020-03-18 ENCOUNTER — Ambulatory Visit (INDEPENDENT_AMBULATORY_CARE_PROVIDER_SITE_OTHER): Payer: Medicare PPO

## 2020-03-18 DIAGNOSIS — R002 Palpitations: Secondary | ICD-10-CM | POA: Diagnosis not present

## 2020-03-18 DIAGNOSIS — G4733 Obstructive sleep apnea (adult) (pediatric): Secondary | ICD-10-CM | POA: Diagnosis not present

## 2020-04-01 ENCOUNTER — Telehealth (HOSPITAL_COMMUNITY): Payer: Self-pay | Admitting: *Deleted

## 2020-04-01 NOTE — Telephone Encounter (Signed)
Close encounter 

## 2020-04-02 ENCOUNTER — Other Ambulatory Visit (HOSPITAL_COMMUNITY)
Admission: RE | Admit: 2020-04-02 | Discharge: 2020-04-02 | Disposition: A | Discharge status: Home / Self Care | Payer: Medicare PPO | Source: Ambulatory Visit | Attending: Cardiology | Admitting: Cardiology

## 2020-04-02 DIAGNOSIS — Z01812 Encounter for preprocedural laboratory examination: Secondary | ICD-10-CM | POA: Diagnosis present

## 2020-04-02 DIAGNOSIS — Z20822 Contact with and (suspected) exposure to covid-19: Secondary | ICD-10-CM | POA: Insufficient documentation

## 2020-04-03 LAB — SARS CORONAVIRUS 2 (TAT 6-24 HRS): SARS Coronavirus 2: NEGATIVE

## 2020-04-06 ENCOUNTER — Ambulatory Visit (INDEPENDENT_AMBULATORY_CARE_PROVIDER_SITE_OTHER): Payer: Medicare PPO

## 2020-04-06 ENCOUNTER — Other Ambulatory Visit: Payer: Self-pay

## 2020-04-06 ENCOUNTER — Ambulatory Visit (HOSPITAL_COMMUNITY): Payer: Medicare PPO | Attending: Cardiovascular Disease

## 2020-04-06 DIAGNOSIS — R002 Palpitations: Secondary | ICD-10-CM | POA: Diagnosis present

## 2020-04-06 DIAGNOSIS — G4733 Obstructive sleep apnea (adult) (pediatric): Secondary | ICD-10-CM

## 2020-04-06 DIAGNOSIS — R011 Cardiac murmur, unspecified: Secondary | ICD-10-CM | POA: Diagnosis present

## 2020-04-06 DIAGNOSIS — R072 Precordial pain: Secondary | ICD-10-CM | POA: Diagnosis not present

## 2020-04-06 LAB — EXERCISE TOLERANCE TEST
Estimated workload: 5 METS
Exercise duration (min): 3 min
Exercise duration (sec): 12 s
MPHR: 155 {beats}/min
Peak HR: 146 {beats}/min
Percent HR: 94 %
RPE: 15
Rest HR: 81 {beats}/min

## 2020-04-19 ENCOUNTER — Ambulatory Visit: Payer: Self-pay | Admitting: Cardiology

## 2020-04-22 ENCOUNTER — Telehealth: Payer: Self-pay | Admitting: Cardiology

## 2020-04-22 NOTE — Telephone Encounter (Signed)
Left message for patient to call back  

## 2020-04-22 NOTE — Telephone Encounter (Signed)
New Message  Pt is returning a call for results   Please call back

## 2020-04-23 NOTE — Telephone Encounter (Signed)
The patient has been notified of the result and verbalized understanding.  All questions (if any) were answered. Antonieta Iba, RN 04/23/2020 2:54 PM  Patient did not want to discuss medication changes and further testing with me. She requested an appointment with Dr. Radford Pax to discuss further.

## 2020-05-27 NOTE — Progress Notes (Signed)
Virtual Visit via Telephone Note   This visit type was conducted due to national recommendations for restrictions regarding the COVID-19 Pandemic (e.g. social distancing) in an effort to limit this patient's exposure and mitigate transmission in our community.  Due to her co-morbid illnesses, this patient is at least at moderate risk for complications without adequate follow up.  This format is felt to be most appropriate for this patient at this time.  The patient did not have access to video technology/had technical difficulties with video requiring transitioning to audio format only (telephone).  All issues noted in this document were discussed and addressed.  No physical exam could be performed with this format.  Please refer to the patient's chart for her  consent to telehealth for [facility].   Evaluation Performed:  Follow-up visit  This visit type was conducted due to national recommendations for restrictions regarding the COVID-19 Pandemic (e.g. social distancing).  This format is felt to be most appropriate for this patient at this time.  All issues noted in this document were discussed and addressed.  No physical exam was performed (except for noted visual exam findings with Video Visits).  Please refer to the patient's chart (MyChart message for video visits and phone note for telephone visits) for the patient's consent to telehealth for [facility].  Date:  05/27/2020   ID:  Taylor Alvarado, DOB 02/14/1956, MRN [id number]  Patient Location:  Home  Provider location:   [facility]  Date:  05/28/2020   ID:  Taylor Alvarado, DOB April 07, 1956, MRN [id number]  PCP:  Reynold Bowen, MD  Cardiologist:  Fransico Him, MD   No chief complaint on file.   History of Present Illness:  Taylor Alvarado is a 64yo female with a hx of GERD, HLD, hypothyroidism, OSA on PAP who recently was seen due to  problems with palpitations that awaken her from sleep at night.  These only occur when she  is in bed at night.  They are associated with severe midsternal chest pain with radiation into her left arm only at night.  The pain feels like a pressure with no associated SOB, Nausea or diaphoresis and usually lasts anywhere from 5 to 29min.  She has had these symptoms for over 5 years but recently they have become more frequent.  In January she started having Migraine HAs and saw Neuro and was dx with cluster HAs.  She underwent Sleep study showing mild OSA and is on CPAP.  Since then her HAs have improved.  She recently saw me for palpitations 2-3x's weekly.  She admits to drinking 16ox of tea a day.  She had a cath 16 years ago that was normal.  She has no hx of tobacco use but dose have a fm hx of CAD and her Dad died of CAD.    She wore a heart monitor showing nonsustained atrial tachycardia and her amlodipine was changed to Cardizem CD 180mg  daily but she has not started this yet.  2D echo showed trivial MR and normal LVF.  ETT showed no ischemia.  It was felt that she had GERD as her sx occur mainly at night and was started on Protonix 40mg  daily but did not start this back.    She is here today for followup and is doing well.  Her palpitatons have improved at night and has only had 1 episode this month.  She thinks that her CP that she gets at night is related to palpitations.  She  never wakes up with CP unless she has palpitations and no CP with exertion during the day.  She denies any SOB, DOE, PND, orthopnea, LE edema, dizziness,  or syncope. She is compliant with her meds and is tolerating meds with no SE.    Past Medical History:  Diagnosis Date   Arthritis    Daytime sleepiness 02/20/2019   GERD (gastroesophageal reflux disease)    Heart palpitations 02/20/2019   High blood pressure 2020   Hyperlipidemia    Hypothyroidism    Migraine    diagnosed at 50, [facility].   Migraine without aura and with status migrainosus, not intractable 12/31/2018   Morning  headache 02/20/2019   Non-restorative sleep 02/20/2019   On home oxygen therapy 03/01/2020   OSA on CPAP 03/01/2020   Osteoarthritis    Sleep-related hypoxia 03/01/2020   Snoring 02/20/2019   Unexplained night sweats 02/20/2019    Past Surgical History:  Procedure Laterality Date   [place]    Current Medications: Current Meds  Medication Sig   amLODipine (NORVASC) 5 MG tablet TK 1 T PO QHS   ibuprofen (ADVIL) 200 MG tablet Take 200 mg by mouth as needed.    olmesartan (BENICAR) 40 MG tablet TK 1 T PO QHS   RESTASIS 0.05 % ophthalmic emulsion Place 1 drop into both eyes daily.    rosuvastatin (CRESTOR) 10 MG tablet Take 10 mg by mouth once a week.    SYNTHROID 88 MCG tablet TK 1 T PO QD   Vitamin D, Cholecalciferol, 25 MCG (1000 UT) TABS Take 1,000 Units by mouth daily.    Allergies:   Penicillins, Septra [sulfamethoxazole-trimethoprim], and Shellfish allergy   Social History   Socioeconomic History   Marital status: Married    Spouse name: Not on file   Number of children: 2   Years of education: Not on file   Highest education level: Bachelor's degree (e.g., BA, AB, BS)  Occupational History   Occupation: [employer] part time    Comment: Hydrologist  Tobacco Use   Smoking status: Never Smoker   Smokeless tobacco: Never Used  Scientific laboratory technician Use: Never used  Substance and Sexual Activity   Alcohol use: Never   Drug use: Never   Sexual activity: Not on file  Other Topics Concern   Not on file  Social History Narrative   Lives at home with husband   Right handed   Caffeine: drinks tea at all meals   Social Determinants of Health   Financial Resource Strain:    Difficulty of Paying Living Expenses:   Food Insecurity:    Worried About Charity fundraiser in the Last Year:    Arboriculturist in the Last Year:   Transportation Needs:    Lexicographer (Medical):    Lack of Transportation (Non-Medical):   Physical Activity:    Days of Exercise per Week:    Minutes of Exercise per Session:   Stress:    Feeling of Stress :   Social Connections:    Frequency of Communication with Friends and Family:    Frequency of Social Gatherings with Friends and Family:    Attends Religious Services:    Active Member of Clubs or Organizations:    Attends Archivist Meetings:    Marital Status:      Family History:  The patient's family history includes Bone cancer in her paternal grandmother; Breast cancer in her maternal grandmother; Diabetes type I in her brother; Diabetes type II in her father; Headache in her mother and sister; Heart disease in her brother and father.   ROS:   Please see the history of present illness.    ROS All other systems reviewed and are negative.  No flowsheet data found.   PHYSICAL EXAM:   VS:  BP 127/83    Pulse 83    Ht 5\' 3"  (1.6 m)    Wt 195 lb (88.5 kg)    BMI 34.54 kg/m     Wt Readings from Last 3 Encounters:  05/28/20 195 lb (88.5 kg)  03/15/20 200 lb 6.4 oz (90.9 kg)  02/11/20 195 lb (88.5 kg)     Studies/Labs Reviewed:    Recent Labs: No results found for requested labs within last 8760 hours.   Lipid Panel No results found for: CHOL, TRIG, HDL, CHOLHDL, VLDL, LDLCALC, LDLDIRECT  Additional studies/ records that were reviewed today include:  2D echo 03/2020 IMPRESSIONS    1. Left ventricular ejection fraction, by estimation, is 60 to 65%. The  left ventricle has normal function. The left ventricle has no regional  wall motion abnormalities. Left ventricular diastolic parameters are  consistent with Grade I diastolic  dysfunction (impaired relaxation). The average left ventricular global  longitudinal strain is -23.7 %. The global longitudinal strain is normal.  2. Right ventricular systolic function is normal. The right ventricular  size is normal.  There is normal pulmonary artery systolic pressure.  3. The mitral valve is normal in structure. Trivial mitral valve  regurgitation. No evidence of mitral stenosis.  4. The aortic valve is tricuspid. Aortic valve regurgitation is not  visualized. No aortic stenosis is present.  5. The inferior vena cava is normal in size with greater than 50%  respiratory variability, suggesting right atrial pressure of 3 mmHg.     ASSESSMENT:    1. Palpitations   2. OSA (obstructive sleep apnea)   3. Heart murmur   4. Precordial pain      PLAN:  In order of problems listed above:  1.  Palpitations/nonsustained atrial tachycardia -event monitor showed PVCs, nonsustained atrial tachycardia and wide complex tachycardia up to 5 beats -her amlodipine was stopped she was started on Cardizem CD 180mg  daily but she has not made change -I have told her to proceed with the medication change to suppress her palpitations -2D echo and ETT was normal -followup with PA in 4 weeks and me in 1 year  2.  OSA -continue CPAP -followed by Dr. Brett Fairy  3.  Heart Murmur -2D echo showed trivial MR  4.  Chest pain -this is atypical in that it only occurs at night and there are no associated symptoms -she does have a fm hx of CAD but later age in life -EKG is normal -ETT showed no ischemia -likely related to GERD vs. Due to palpitatoins  Medication Adjustments/Labs and Tests Ordered: Current medicines are reviewed at length with the patient today.  Concerns regarding medicines are outlined above.  Medication changes, Labs and Tests ordered today are listed in the Patient Instructions below.  There are no Patient Instructions on file for this visit.   Signed, Fransico Him, MD  05/28/2020 9:40 AM    [address], [place], [place]  [address] Phone: [phone]; Fax: [phone]

## 2020-05-28 ENCOUNTER — Encounter: Payer: Self-pay | Admitting: Cardiology

## 2020-05-28 ENCOUNTER — Telehealth (INDEPENDENT_AMBULATORY_CARE_PROVIDER_SITE_OTHER): Payer: Medicare PPO | Admitting: Cardiology

## 2020-05-28 ENCOUNTER — Other Ambulatory Visit: Payer: Self-pay

## 2020-05-28 VITALS — BP 127/83 | HR 83 | Ht 63.0 in | Wt 195.0 lb

## 2020-05-28 DIAGNOSIS — G4733 Obstructive sleep apnea (adult) (pediatric): Secondary | ICD-10-CM | POA: Diagnosis not present

## 2020-05-28 DIAGNOSIS — R011 Cardiac murmur, unspecified: Secondary | ICD-10-CM | POA: Diagnosis not present

## 2020-05-28 DIAGNOSIS — R002 Palpitations: Secondary | ICD-10-CM | POA: Diagnosis not present

## 2020-05-28 DIAGNOSIS — R072 Precordial pain: Secondary | ICD-10-CM

## 2020-05-28 MED ORDER — DILTIAZEM HCL ER COATED BEADS 180 MG PO CP24
180.0000 mg | ORAL_CAPSULE | Freq: Every day | ORAL | 3 refills | Status: DC
Start: 2020-05-28 — End: 2020-11-22

## 2020-05-28 NOTE — Patient Instructions (Signed)
Medication Instructions:  Your physician has recommended you make the following change in your medication:  1) STOP taking amlodipine 2) START taking Cardizem 180 mg daily  *If you need a refill on your cardiac medications before your next appointment, please call your pharmacy*  Follow-Up: At [facility], you and your health needs are our priority.  As part of our continuing mission to provide you with exceptional heart care, we have created designated Provider Care Teams.  These Care Teams include your primary Cardiologist (physician) and Advanced Practice Providers (APPs -  Physician Assistants and Nurse Practitioners) who all work together to provide you with the care you need, when you need it.  Your next appointment:   4 week(s)  The format for your next appointment:   Virtual Visit   Provider:   Melina Copa, PA-C or Ermalinda Barrios, PA-C  Follow up with Dr. Radford Pax in one year.

## 2020-05-28 NOTE — Addendum Note (Signed)
Addended by: Antonieta Iba on: 05/28/2020 09:59 AM   Modules accepted: Orders

## 2020-06-28 NOTE — Progress Notes (Signed)
Virtual Visit via Video Note   This visit type was conducted due to national recommendations for restrictions regarding the COVID-19 Pandemic (e.g. social distancing) in an effort to limit this patient's exposure and mitigate transmission in our community.  Due to her co-morbid illnesses, this patient is at least at moderate risk for complications without adequate follow up.  This format is felt to be most appropriate for this patient at this time.  All issues noted in this document were discussed and addressed.  A limited physical exam was performed with this format.  Please refer to the patient's chart for her consent to telehealth for [facility].       Date:  06/30/2020   ID:  Taylor Alvarado, DOB 06-07-1956, MRN [id number] The patient was identified using 2 identifiers.  Patient Location: Home Provider Location: Office/Clinic  PCP:  Reynold Bowen, MD  Cardiologist:  Fransico Him, MD   Electrophysiologist:  None   Evaluation Performed:  Follow-Up Visit  Chief Complaint:  palpitations  History of Present Illness:    Taylor Alvarado is a 64 y.o. female with with a hx of GERD, HLD, hypothyroidism, OSA on PAP, normal cath 16 years ago, family history of CAD father died of an MI who recently was seen due to  problems with palpitations that awaken her from sleep at night.  These only occur when she is in bed at night.  They are associated with severe midsternal chest pain with radiation into her left arm only at night.  The pain feels like a pressure with no associated SOB, Nausea or diaphoresis and usually lasts anywhere from 5 to 39min.  She has had these symptoms for over 5 years but recently they have become more frequent.   Holter monitor showed sustained atrial tachycardia and amlodipine was changed to diltiazem 180 mg daily.  She never started it.  She had a video visit with Dr. Radford Pax 05/28/2020 at which time she encouraged her to make the change to diltiazem.  2D echo and ETT was  normal.  Patient till having palpitations 8 times at night that wakes her up. Lasts a few minutes but sometimes 10-15 min because it scares her. Pulse is racing associated with pressure. Using her CPAP at night.   The patient does not have symptoms concerning for COVID-19 infection (fever, chills, cough, or new shortness of breath).    Past Medical History:  Diagnosis Date  . Arthritis   . Daytime sleepiness 02/20/2019  . GERD (gastroesophageal reflux disease)   . Heart palpitations 02/20/2019  . High blood pressure 2020  . Hyperlipidemia   . Hypothyroidism   . Migraine    diagnosed at 57, [facility]  . Migraine without aura and with status migrainosus, not intractable 12/31/2018  . Morning headache 02/20/2019  . Non-restorative sleep 02/20/2019  . On home oxygen therapy 03/01/2020  . OSA on CPAP 03/01/2020  . Osteoarthritis   . Sleep-related hypoxia 03/01/2020  . Snoring 02/20/2019  . Unexplained night sweats 02/20/2019   Past Surgical History:  Procedure Laterality Date  . [place]  . TONSILLECTOMY  1966  . TUBAL LIGATION  1995     Current Meds  Medication Sig  . diltiazem (CARDIZEM CD) 180 MG 24 hr capsule Take 1 capsule (180 mg total) by mouth daily.  Marland Kitchen olmesartan (BENICAR) 40 MG tablet Take 40 mg by mouth daily.   . RESTASIS 0.05 % ophthalmic emulsion Place 1 drop into both eyes  daily.   . rosuvastatin (CRESTOR) 10 MG tablet Take 10 mg by mouth once a week.   Marland Kitchen SYNTHROID 88 MCG tablet TK 1 T PO QD  . Vitamin D, Cholecalciferol, 25 MCG (1000 UT) TABS Take 1,000 Units by mouth daily.     Allergies:   Penicillins, Septra [sulfamethoxazole-trimethoprim], and Shellfish allergy   Social History   Tobacco Use  . Smoking status: Never Smoker  . Smokeless tobacco: Never Used  Vaping Use  . Vaping Use: Never used  Substance Use Topics  . Alcohol use: Never  . Drug use: Never     Family Hx: The patient's family history includes Bone  cancer in her paternal grandmother; Breast cancer in her maternal grandmother; Diabetes type I in her brother; Diabetes type II in her father; Headache in her mother and sister; Heart disease in her brother and father. There is no history of Colon cancer.  ROS:   Please see the history of present illness.      All other systems reviewed and are negative.   Prior CV studies:   The following studies were reviewed today:  2D echo 03/2020 IMPRESSIONS     1. Left ventricular ejection fraction, by estimation, is 60 to 65%. The  left ventricle has normal function. The left ventricle has no regional  wall motion abnormalities. Left ventricular diastolic parameters are  consistent with Grade I diastolic  dysfunction (impaired relaxation). The average left ventricular global  longitudinal strain is -23.7 %. The global longitudinal strain is normal.   2. Right ventricular systolic function is normal. The right ventricular  size is normal. There is normal pulmonary artery systolic pressure.   3. The mitral valve is normal in structure. Trivial mitral valve  regurgitation. No evidence of mitral stenosis.   4. The aortic valve is tricuspid. Aortic valve regurgitation is not  visualized. No aortic stenosis is present.   5. The inferior vena cava is normal in size with greater than 50%  respiratory variability, suggesting right atrial pressure of 3 mmHg.          Labs/Other Tests and Data Reviewed:    EKG:  holter monitor reviewed  Recent Labs: No results found for requested labs within last 8760 hours.   Recent Lipid Panel No results found for: CHOL, TRIG, HDL, CHOLHDL, LDLCALC, LDLDIRECT  Wt Readings from Last 3 Encounters:  06/30/20 195 lb (88.5 kg)  05/28/20 195 lb (88.5 kg)  03/15/20 200 lb 6.4 oz (90.9 kg)     Objective:    Vital Signs:  BP 136/82   Pulse 67   Ht 5\' 3"  (1.6 m)   Wt 195 lb (88.5 kg)   BMI 34.54 kg/m    VITAL SIGNS:  reviewed GEN:  no acute  distress RESPIRATORY:  normal respiratory effort, symmetric expansion CARDIOVASCULAR:  no peripheral edema  ASSESSMENT & PLAN:    Palpitations with atrial tachycardia on event monitor as well as PVCs/WCT.  Amlodipine switched to diltiazem. Patient continues to have palpitations that awaken her at night. Doesn't want to increase meds yet. MRI was supposed to be ordered to rule out infiltrative process. Will look into. GXT and echo normal. F/u with Dr. Radford Pax after MRI  OSA on CPAP- compliant  Hyperlipidemia LDL 127 01/2020 on Crestor  COVID-19 Education: The signs and symptoms of COVID-19 were discussed with the patient and how to seek care for testing (follow up with PCP or arrange E-visit).  The importance of social distancing was discussed today.  Time:   Today, I have spent 15:28 minutes with the patient with telehealth technology discussing the above problems.     Medication Adjustments/Labs and Tests Ordered: Current medicines are reviewed at length with the patient today.  Concerns regarding medicines are outlined above.   Tests Ordered: No orders of the defined types were placed in this encounter.   Medication Changes: No orders of the defined types were placed in this encounter.   Follow Up:  In Person in 2 month(s) or after MRI complete with Dr. Radford Pax  Signed, Ermalinda Barrios, PA-C  06/30/2020 9:21 AM    [place]

## 2020-06-30 ENCOUNTER — Telehealth: Payer: Self-pay | Admitting: Cardiology

## 2020-06-30 ENCOUNTER — Other Ambulatory Visit: Payer: Self-pay

## 2020-06-30 ENCOUNTER — Telehealth (INDEPENDENT_AMBULATORY_CARE_PROVIDER_SITE_OTHER): Payer: Medicare PPO | Admitting: Physician Assistant

## 2020-06-30 ENCOUNTER — Encounter: Payer: Self-pay | Admitting: Physician Assistant

## 2020-06-30 VITALS — BP 136/82 | HR 67 | Ht 63.0 in | Wt 195.0 lb

## 2020-06-30 DIAGNOSIS — R002 Palpitations: Secondary | ICD-10-CM

## 2020-06-30 DIAGNOSIS — I472 Ventricular tachycardia: Secondary | ICD-10-CM | POA: Diagnosis not present

## 2020-06-30 DIAGNOSIS — G4733 Obstructive sleep apnea (adult) (pediatric): Secondary | ICD-10-CM | POA: Diagnosis not present

## 2020-06-30 DIAGNOSIS — E785 Hyperlipidemia, unspecified: Secondary | ICD-10-CM

## 2020-06-30 DIAGNOSIS — R000 Tachycardia, unspecified: Secondary | ICD-10-CM

## 2020-06-30 DIAGNOSIS — Z9989 Dependence on other enabling machines and devices: Secondary | ICD-10-CM

## 2020-06-30 DIAGNOSIS — I471 Supraventricular tachycardia: Secondary | ICD-10-CM | POA: Diagnosis not present

## 2020-06-30 NOTE — Progress Notes (Signed)
Taylor Margarita, MD  04/21/2020 9:16 AM EDT     Event monitor showed extra heart beats from the top and bottom of her heart. These are usually benign especially in setting of normal echo and ETT. Please d/c amlodipine and start Cardizem CD 180mg  daily. Check HR and BP daily for a week and call with results. Followup with PA in 4 weeks. Please get cardiac MRI to rule out infiltrative disease that could cause wide complex tachycardia

## 2020-06-30 NOTE — Patient Instructions (Addendum)
Medication Instructions:  Your physician recommends that you continue on your current medications as directed. Please refer to the Current Medication list given to you today.  *If you need a refill on your cardiac medications before your next appointment, please call your pharmacy*   Lab Work: None ordered  If you have labs (blood work) drawn today and your tests are completely normal, you will receive your results only by: Marland Kitchen MyChart Message (if you have MyChart) OR . A paper copy in the mail If you have any lab test that is abnormal or we need to change your treatment, we will call you to review the results.   Follow-Up: At [facility], you and your health needs are our priority.  As part of our continuing mission to provide you with exceptional heart care, we have created designated Provider Care Teams.  These Care Teams include your primary Cardiologist (physician) and Advanced Practice Providers (APPs -  Physician Assistants and Nurse Practitioners) who all work together to provide you with the care you need, when you need it.  We recommend signing up for the patient portal called "MyChart".  Sign up information is provided on this After Visit Summary.  MyChart is used to connect with patients for Virtual Visits (Telemedicine).  Patients are able to view lab/test results, encounter notes, upcoming appointments, etc.  Non-urgent messages can be sent to your provider as well.   To learn more about what you can do with MyChart, go to [email or web address].    Your next appointment:   AFTER CARDIAC MRI IS COMPLETE  Carly will contact you regarding this.   The format for your next appointment:   In Person  Provider:   You may see Fransico Him, MD or one of the following Advanced Practice Providers on your designated Care Team:    Melina Copa, PA-C  Ermalinda Barrios, PA-C    Other Instructions Decrease caffeine after your afternoon Tea.

## 2020-06-30 NOTE — Telephone Encounter (Signed)
Spoke with patient regarding preferred weekday and times to schedule the Cardiac MRI ordered by Dr Midge Minium patient I would call with appointment as soon as we hear regarding the inusrance prior authorizaton

## 2020-07-01 ENCOUNTER — Telehealth: Payer: Self-pay | Admitting: Cardiology

## 2020-07-01 ENCOUNTER — Encounter: Payer: Self-pay | Admitting: Cardiology

## 2020-07-01 NOTE — Telephone Encounter (Signed)
Left message for patient regarding appointment for Cardiac MRI scheduled 07/15/20 at 8:00 am at [facility]---arrival time is 7:30 am-1st floor admissions----will mail information to patient---it is also in My Chart----asked patient to call with questions or concerns

## 2020-07-15 ENCOUNTER — Other Ambulatory Visit (HOSPITAL_COMMUNITY): Payer: Medicare PPO

## 2020-07-15 NOTE — Telephone Encounter (Signed)
Opened in error

## 2020-07-22 ENCOUNTER — Telehealth: Payer: Self-pay | Admitting: Cardiology

## 2020-07-22 NOTE — Telephone Encounter (Signed)
New Message:    Pt is scheduled for a MRI on 08-03-20. She would like Dr Radford Pax to prescribe a sedative for her for the test please.

## 2020-07-25 NOTE — Telephone Encounter (Signed)
Please find out if she has had a sedative in the past and if she is on O2 with her CPAP

## 2020-07-26 NOTE — Telephone Encounter (Signed)
Patient states that she has not had a sedative in the past. She has only had open MRIs but does not do well in enclosed spaces and is nervous.  She also reports that she has not been on O2 with CPAP since April 2021 when she no longer qualified.

## 2020-07-26 NOTE — Telephone Encounter (Signed)
Would use Valium 2.5mg  to take on arrival to MRI.  Dispense #1 tablet with no refills

## 2020-07-28 MED ORDER — DIAZEPAM 5 MG PO TABS: ORAL_TABLET | ORAL | 0 refills | Status: DC

## 2020-07-28 NOTE — Telephone Encounter (Signed)
Left message for patient - will call in Rx for valium 2.5 mg one tablet with no refills.

## 2020-08-02 ENCOUNTER — Telehealth (HOSPITAL_COMMUNITY): Payer: Self-pay | Admitting: Emergency Medicine

## 2020-08-02 NOTE — Telephone Encounter (Signed)
Reaching out to patient to offer assistance regarding upcoming cardiac imaging study; pt verbalizes understanding of appt date/time, parking situation and where to check in, pre-test NPO status and medications ordered, and verified current allergies; name and call back number provided for further questions should they arise Taylor Bond RN Navigator Cardiac Imaging Taylor [facility] [phone] office [phone] cell   2.5mg  valium given for claustro. Instructed to take 30 mins prior to appt and she MUST have a driver.   Pt denies implants.  Taylor Alvarado

## 2020-08-03 ENCOUNTER — Other Ambulatory Visit: Payer: Self-pay

## 2020-08-03 ENCOUNTER — Ambulatory Visit (HOSPITAL_COMMUNITY)
Admission: RE | Admit: 2020-08-03 | Discharge: 2020-08-03 | Disposition: A | Discharge status: Home / Self Care | Payer: Medicare PPO | Source: Ambulatory Visit | Attending: Cardiology | Admitting: Cardiology

## 2020-08-03 DIAGNOSIS — I472 Ventricular tachycardia: Secondary | ICD-10-CM | POA: Insufficient documentation

## 2020-08-03 DIAGNOSIS — R000 Tachycardia, unspecified: Secondary | ICD-10-CM

## 2020-08-03 MED ORDER — GADOBUTROL 1 MMOL/ML IV SOLN
10.0000 mL | Freq: Once | INTRAVENOUS | Status: AC | PRN
  Administered 2020-08-03: 10 mL via INTRAVENOUS

## 2020-08-12 ENCOUNTER — Ambulatory Visit: Payer: Medicare PPO | Admitting: Adult Health

## 2020-08-18 ENCOUNTER — Telehealth: Payer: Self-pay

## 2020-08-18 DIAGNOSIS — R000 Tachycardia, unspecified: Secondary | ICD-10-CM

## 2020-08-18 DIAGNOSIS — I472 Ventricular tachycardia: Secondary | ICD-10-CM

## 2020-08-18 NOTE — Telephone Encounter (Signed)
-----   Message from Dorothy Spark, MD sent at 08/05/2020 11:09 PM EDT ----- Repeat CMR in 3 months, if she has persistent LGE at that time, refer to [facility] for PET CT for sarcoid evaluation.

## 2020-09-09 ENCOUNTER — Ambulatory Visit: Payer: Medicare PPO | Admitting: Neurology

## 2020-09-09 ENCOUNTER — Encounter: Payer: Self-pay | Admitting: Neurology

## 2020-09-09 ENCOUNTER — Other Ambulatory Visit: Payer: Self-pay

## 2020-09-09 VITALS — BP 148/86 | HR 58 | Ht 63.0 in | Wt 203.0 lb

## 2020-09-09 DIAGNOSIS — G43001 Migraine without aura, not intractable, with status migrainosus: Secondary | ICD-10-CM | POA: Diagnosis not present

## 2020-09-09 DIAGNOSIS — G4733 Obstructive sleep apnea (adult) (pediatric): Secondary | ICD-10-CM

## 2020-09-09 DIAGNOSIS — Z9989 Dependence on other enabling machines and devices: Secondary | ICD-10-CM

## 2020-09-09 DIAGNOSIS — R002 Palpitations: Secondary | ICD-10-CM | POA: Diagnosis not present

## 2020-09-09 NOTE — Progress Notes (Signed)
PATIENT: Taylor Alvarado DOB: 1956-06-07  REASON FOR VISIT: follow up for CPAP- Primary neurologist is Dr Jaynee Eagles:  Dr. Jaynee Eagles has seen the patient for headaches and referred to sleep medicine/ Megan Millikan,NP  HISTORY FROM: patient  HISTORY OF PRESENT ILLNESS: 09/09/20: Interval history.  I have the pleasure for the first time to meet with Taylor Alvarado and meanwhile 64 year old patient of Dr. Lavell Anchors in person today.  Today is 09-09-20 Veterans Day 2021.  The patient underwent several sleep studies and evaluations in the past she had to be retested because she switched to Medicare and we had to establish a new baseline.  Her last 2 visits were with [name] nurse practitioner.  Dr. Jaynee Eagles has seen the patient for headaches.  She has a migrainous headache.  The current compliance record on strongly CPAP is 100% for days and hours with an average use at time of 7 hours 40 minutes, a minimum pressure of 5 maximum pressure of 15 cmH2O was 2 cm expiratory pressure relief.  She does have significant air leakage with her current interface so that may not be the best of seals however her residual apnea index is low and shows that the condition is well controlled with an AHI of 1.6.  Her 95th percentile pressure is 12 cmH2O well within the current settings which do not have to be adjusted.  The air leakage at the 95th percentile is 16.6 L/min.  Her nocturia is down to zero from 2 at night, her migraines were no longer uncontrolled, thee were no clusters anymore, and she has no more morning headaches on the CPAP machine. This is her first time on CPAP-  The air-leaks do not impair her sleep.  but still some HTN peaks, palpitations. Cardiology started on Cardiazem, had a cardiac monitor for a month ( Dr Golden Hurter). Cardiac  MRI with scar midwall LGE, EF 61%.     Ward Givens, NP  [name] is a 64 year old female with a history of obstructive sleep apnea on CPAP.  She returns today for  follow-up.  Her download indicates that she used her machine nightly for compliance of 100%.  She used her machine greater than 4 hours each night.  On average she uses her machine 7 hours and 52 minutes.  Her residual AHI is 0.9 on 5 to 15 cm of water with EPR 2.  Leak in the 95th percentile is 20.8 L/min.  She has nocturnal O2 however due to her insurance change and she now needs a CPAP titration in order to have this approved through her insurance.  Patient reports that her headaches have improved.  No longer having cluster headaches.  She reports that she continues to have palpitations during the night.  She has not had a formal cardiac work-up.  HISTORY 06/26/19:  [name] is a 64 year old female with a history of obstructive sleep apnea on CPAP.  She returns today for follow-up.  Her download indicates that she used her machine 29 out of 30 days for compliance of 97%.  She use her machine greater than 4 hours each night.  On average she uses her machine 7 hours and 32 minutes.  Her residual AHI is 0.9 on 5 to 15 cm of water with EPR of 2.  Her leak in the 95th percentile is 15.1.  She states that since using the machine the cardiac episodes that she was having at night have decreased but not completely resolved.  She states  that there is been several occasions that she wakes up with mild chest pain and feels as if her heart is racing.  But does notice improved slightly since starting CPAP.  The patient did have overnight pulse oximetry.  Her O2 remained less than 89% for 12 minutes and 37 seconds.  She does qualify for supplemental oxygen.  This has not been started yet as we just got the results from her DME company today.  She returns today for evaluation.  REVIEW OF SYSTEMS: Out of a complete 14 system review of symptoms, the patient complains only of the following symptoms, and all other reviewed systems are negative.  NOM76 ESS 7  ALLERGIES: Allergies  Allergen Reactions  . Penicillins  Itching    Throat swells  . Septra [Sulfamethoxazole-Trimethoprim] Hives  . Shellfish Allergy Itching    Tightness in throat    HOME MEDICATIONS: Outpatient Medications Prior to Visit  Medication Sig Dispense Refill  . diltiazem (CARDIZEM CD) 180 MG 24 hr capsule Take 1 capsule (180 mg total) by mouth daily. 90 capsule 3  . olmesartan (BENICAR) 40 MG tablet Take 40 mg by mouth daily.     . RESTASIS 0.05 % ophthalmic emulsion Place 1 drop into both eyes daily.     . rosuvastatin (CRESTOR) 10 MG tablet Take 10 mg by mouth once a week.     Marland Kitchen SYNTHROID 88 MCG tablet TK 1 T PO QD    . Vitamin D, Cholecalciferol, 25 MCG (1000 UT) TABS Take 1,000 Units by mouth daily.    . diazepam (VALIUM) 5 MG tablet Take  2.5 mg (1/2 tablet) upon arrival to MRI. 0.5 tablet 0  . ibuprofen (ADVIL) 200 MG tablet Take 200 mg by mouth as needed.      No facility-administered medications prior to visit.    PAST MEDICAL HISTORY: Past Medical History:  Diagnosis Date  . Arthritis   . Daytime sleepiness 02/20/2019  . GERD (gastroesophageal reflux disease)   . Heart palpitations 02/20/2019  . High blood pressure 2020  . Hyperlipidemia   . Hypothyroidism   . Migraine    diagnosed at 15, [facility].  . Migraine without aura and with status migrainosus, not intractable 12/31/2018  . Morning headache 02/20/2019  . Non-restorative sleep 02/20/2019  . On home oxygen therapy 03/01/2020  . OSA on CPAP 03/01/2020  . Osteoarthritis   . Sleep-related hypoxia 03/01/2020  . Snoring 02/20/2019  . Unexplained night sweats 02/20/2019    PAST SURGICAL HISTORY: Past Surgical History:  Procedure Laterality Date  . [place]  . TONSILLECTOMY  1966  . TUBAL LIGATION  1995    FAMILY HISTORY: Family History  Problem Relation Age of Onset  . Heart disease Father   . Diabetes type II Father   . Heart disease Brother   . Diabetes type I Brother   . Headache Mother        during  menstruation  . Headache Sister        during menstruation  . Breast cancer Maternal Grandmother   . Bone cancer Paternal Grandmother   . Colon cancer Neg Hx     SOCIAL HISTORY: Social History   Socioeconomic History  . Marital status: Married    Spouse name: Not on file  . Number of children: 2  . Years of education: Not on file  . Highest education level: Bachelor's degree (e.g., BA, AB, BS)  Occupational History  . Occupation: tudor  part time    Comment: [employer]  Tobacco Use  . Smoking status: Never Smoker  . Smokeless tobacco: Never Used  Vaping Use  . Vaping Use: Never used  Substance and Sexual Activity  . Alcohol use: Never  . Drug use: Never  . Sexual activity: Not on file  Other Topics Concern  . Not on file  Social History Narrative   Lives at home with husband   Right handed   Caffeine: drinks tea at all meals   Social Determinants of Health   Financial Resource Strain:   . Difficulty of Paying Living Expenses: Not on file  Food Insecurity:   . Worried About Charity fundraiser in the Last Year: Not on file  . Ran Out of Food in the Last Year: Not on file  Transportation Needs:   . Lack of Transportation (Medical): Not on file  . Lack of Transportation (Non-Medical): Not on file  Physical Activity:   . Days of Exercise per Week: Not on file  . Minutes of Exercise per Session: Not on file  Stress:   . Feeling of Stress : Not on file  Social Connections:   . Frequency of Communication with Friends and Family: Not on file  . Frequency of Social Gatherings with Friends and Family: Not on file  . Attends Religious Services: Not on file  . Active Member of Clubs or Organizations: Not on file  . Attends Archivist Meetings: Not on file  . Marital Status: Not on file  Intimate Partner Violence:   . Fear of Current or Ex-Partner: Not on file  . Emotionally Abused: Not on file  . Physically Abused: Not on file  . Sexually  Abused: Not on file      PHYSICAL EXAM  Vitals:   09/09/20 1423  BP: (!) 148/86  Pulse: (!) 58  Weight: 203 lb (92.1 kg)  Height: 5\' 3"  (1.6 m)   Body mass index is 35.96 kg/m.  Generalized: Well developed, in no acute distress  Chest: Lungs clear to auscultation bilaterally  Neurological examination  Mentation: Alert oriented to time, place, history taking. Follows all commands speech and language fluent Cranial nerve II-XII: Extraocular movements were full, visual field were full on confrontational test Head turning and shoulder shrug  were normal and symmetric. Motor: The motor testing reveals 5 over 5 strength of all 4 extremities. Good symmetric motor tone is noted throughout.  Sensory: Sensory testing is intact to soft touch on all 4 extremities. No evidence of extinction is noted.  Gait and station: Gait is normal.    DIAGNOSTIC DATA (LABS, IMAGING, TESTING) - I reviewed patient records, labs, notes, testing and imaging myself where available.  No results found for: WBC, HGB, HCT, MCV, PLT No results found for: NA, K, CL, CO2, GLUCOSE, BUN, CREATININE, CALCIUM, PROT, ALBUMIN, AST, ALT, ALKPHOS, BILITOT, GFRNONAA, GFRAA No results found for: CHOL, HDL, LDLCALC, LDLDIRECT, TRIG, CHOLHDL No results found for: HGBA1C No results found for: VITAMINB12 No results found for: TSH    ASSESSMENT AND PLAN 87. 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 OSA on CPAP, new machine in use.  3. Palpitations.  4. Migraines and cluster headaches have resolved.  - CPAP compliance excellent - Good treatment of AHI - in spite of air-leak,  Patient reports still feeling palpitations at night only.  See cardiology again for  palpitations during the night, Cardiazem has not prevented these either.  advised to follow-up with PCP.  Even xanax did not prevent palpitations.  - - F/U in 12 months or sooner if needed with Np    I spent 31 minutes of face-to-face and  non-face-to-face time with patient.  This included previsit chart review, lab review, study review, order entry, electronic health record documentation, patient education.  Larey Seat, MD    09/09/2020, 2:57 PM [facility] [address], [place], [place] [address] [phone]

## 2020-09-10 ENCOUNTER — Telehealth: Payer: Self-pay

## 2020-09-10 ENCOUNTER — Telehealth: Payer: Self-pay | Admitting: Radiology

## 2020-09-10 DIAGNOSIS — R002 Palpitations: Secondary | ICD-10-CM

## 2020-09-10 NOTE — Telephone Encounter (Signed)
Spoke with the patient and placed orders for 2 week zio patch.

## 2020-09-10 NOTE — Telephone Encounter (Signed)
-----   Message from Sueanne Margarita, MD sent at 09/09/2020  7:13 PM EST ----- Please order 2 week Ziopatch for palpitations  Traci ----- Message ----- From: Larey Seat, MD Sent: 09/09/2020   3:18 PM EST To: Sueanne Margarita, MD

## 2020-09-10 NOTE — Telephone Encounter (Signed)
Enrolled patient for a 14 day Zio XT  monitor to be mailed to patients home  °

## 2020-09-11 ENCOUNTER — Ambulatory Visit: Payer: Medicare PPO

## 2020-09-14 ENCOUNTER — Other Ambulatory Visit (INDEPENDENT_AMBULATORY_CARE_PROVIDER_SITE_OTHER): Payer: Medicare PPO

## 2020-09-14 DIAGNOSIS — R002 Palpitations: Secondary | ICD-10-CM | POA: Diagnosis not present

## 2020-10-06 ENCOUNTER — Ambulatory Visit: Payer: Medicare PPO | Admitting: Cardiology

## 2020-10-06 ENCOUNTER — Encounter: Payer: Self-pay | Admitting: Cardiology

## 2020-10-06 VITALS — BP 136/72 | HR 82 | Ht 63.0 in | Wt 206.8 lb

## 2020-10-06 DIAGNOSIS — I471 Supraventricular tachycardia: Secondary | ICD-10-CM

## 2020-10-06 DIAGNOSIS — I472 Ventricular tachycardia: Secondary | ICD-10-CM | POA: Diagnosis not present

## 2020-10-06 DIAGNOSIS — R000 Tachycardia, unspecified: Secondary | ICD-10-CM

## 2020-10-06 DIAGNOSIS — D869 Sarcoidosis, unspecified: Secondary | ICD-10-CM | POA: Diagnosis not present

## 2020-10-06 DIAGNOSIS — R072 Precordial pain: Secondary | ICD-10-CM | POA: Diagnosis not present

## 2020-10-06 NOTE — Progress Notes (Signed)
Date:  05/27/2020   ID:  [name], DOB [birth year], MRN [id number]  Date:  10/06/2020   ID:  [name], DOB October 12, 1956, MRN [id number]  PCP:  Reynold Bowen, MD  Cardiologist:  Taylor Him, MD   Chief Complaint  Patient presents with  . Follow-up    atrial tachycardia, PVCs    History of Present Illness:  Taylor Alvarado is a 64yo female with a hx of GERD, HLD, hypothyroidism, OSA on PAP (followed by Neuro) and a 5 year hx of palpitations that awaken her from sleep at night and would only occur when she was in bed at night. She was also having CP with them only at night. She underwent Sleep study showing mild OSA and is on CPAP.  Since then her HAs have improved. Her palpitations seem to be worsened with caffeine intake. She wore a heart monitor showing nonsustained atrial tachycardia/PVCs and WCT and her amlodipine was changed to Cardizem CD 180mg  daily.   She had a cath 16 years ago that was normal.   2D echo showed trivial MR and normal LVF.  ETT showed no ischemia.  She has no hx of tobacco use but dose have a fm hx of CAD and her Dad died of CAD. 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  She is here today for followup and is doing well.  She still has pressure in her chest at night when she gets the palpitations.  The palpitations still occur sporadically and recently had problems and wore a 2 week Ziopatch and just handed it in.   She denies any SOB, DOE, PND, orthopnea, dizziness or syncope. She is compliant with her meds and is tolerating meds with no SE.   Past Medical History:  Diagnosis Date  . Arthritis   . Daytime sleepiness 02/20/2019  . GERD (gastroesophageal reflux disease)   . Heart palpitations 02/20/2019  . High blood pressure 2020  . Hyperlipidemia   . Hypothyroidism   . Migraine     diagnosed at 57, [facility].  . Migraine without aura and with status migrainosus, not intractable 12/31/2018  . Morning headache 02/20/2019  . Non-restorative sleep 02/20/2019  . On home oxygen therapy 03/01/2020  . OSA on CPAP 03/01/2020  . Osteoarthritis   . Sleep-related hypoxia 03/01/2020  . Snoring 02/20/2019  . Unexplained night sweats 02/20/2019    Past Surgical History:  Procedure Laterality Date  . [place]  . TONSILLECTOMY  1966  . TUBAL LIGATION  1995    Current Medications: Current Meds  Medication Sig  . diltiazem (CARDIZEM CD) 180 MG 24 hr capsule Take 1 capsule (180 mg total) by mouth daily.  Marland Kitchen olmesartan (BENICAR) 40 MG tablet Take 40 mg by mouth daily.   . RESTASIS 0.05 % ophthalmic emulsion Place 1 drop into both eyes daily.   . rosuvastatin (CRESTOR) 10 MG tablet Take 10 mg by mouth once a week.   Marland Kitchen SYNTHROID 88 MCG tablet TK 1 T PO QD  . Vitamin D, Cholecalciferol, 25 MCG (1000 UT) TABS Take 1,000 Units by mouth daily.    Allergies:   Penicillins, Septra [sulfamethoxazole-trimethoprim], and Shellfish allergy   Social History   Socioeconomic History  . Marital status: Married    Spouse name: Not on  file  . Number of children: 2  . Years of education: Not on file  . Highest education level: Bachelor's degree (e.g., BA, AB, BS)  Occupational History  . 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 Smoking status: Never Smoker  . Smokeless tobacco: Never Used  Vaping Use  . Vaping Use: Never used  Substance and Sexual Activity  . Alcohol use: Never  . Drug use: Never  . Sexual activity: Not on file  Other Topics Concern  . Not on file  Social History Narrative   Lives at home with husband   Right handed   Caffeine: drinks tea at all meals   Social Determinants of Health   Financial Resource Strain:   . Difficulty of Paying Living Expenses: Not on file  Food Insecurity:   .  Worried About Charity fundraiser in the Last Year: Not on file  . Ran Out of Food in the Last Year: Not on file  Transportation Needs:   . Lack of Transportation (Medical): Not on file  . Lack of Transportation (Non-Medical): Not on file  Physical Activity:   . Days of Exercise per Week: Not on file  . Minutes of Exercise per Session: Not on file  Stress:   . Feeling of Stress : Not on file  Social Connections:   . Frequency of Communication with Friends and Family: Not on file  . Frequency of Social Gatherings with Friends and Family: Not on file  . Attends Religious Services: Not on file  . Active Member of Clubs or Organizations: Not on file  . Attends Archivist Meetings: Not on file  . Marital Status: Not on file     Family History:  The patient's family history includes Bone cancer in her paternal grandmother; Breast cancer in her maternal grandmother; Diabetes type I in her brother; Diabetes type II in her father; Headache in her mother and sister; Heart disease in her brother and father.   ROS:   Please see the history of present illness.    ROS All other systems reviewed and are negative.  No flowsheet data found.   PHYSICAL EXAM:   VS:  BP 136/72   Pulse 82   Ht 5\' 3"  (1.6 m)   Wt 206 lb 12.8 oz (93.8 kg)   SpO2 96%   BMI 36.63 kg/m     Wt Readings from Last 3 Encounters:  10/06/20 206 lb 12.8 oz (93.8 kg)  09/09/20 203 lb (92.1 kg)  06/30/20 195 lb (88.5 kg)     Studies/Labs Reviewed:    Recent Labs: No results found for requested labs within last 8760 hours.   Lipid Panel No results found for: CHOL, TRIG, HDL, CHOLHDL, VLDL, LDLCALC, LDLDIRECT  Additional studies/ records that were reviewed today include:  2D echo 03/2020 IMPRESSIONS    1. Left ventricular ejection fraction, by estimation, is 60 to 65%. The  left ventricle has normal function. The left ventricle has no regional  wall motion abnormalities. Left ventricular diastolic  parameters are  consistent with Grade I diastolic  dysfunction (impaired relaxation). The average left ventricular global  longitudinal strain is -23.7 %. The global longitudinal strain is normal.  2. Right ventricular systolic function is normal. The right ventricular  size is normal. There is normal pulmonary artery systolic pressure.  3. The mitral valve is normal in structure. Trivial mitral valve  regurgitation. No evidence of mitral stenosis.  4. The aortic valve is tricuspid. Aortic valve regurgitation is not  visualized. No aortic stenosis is present.  5. The inferior vena cava is normal in size with greater than 50%  respiratory variability, suggesting right atrial pressure of 3 mmHg.     ASSESSMENT:    1. Atrial tachycardia ([place])   2. Precordial pain   3. Wide-complex tachycardia ([place])   4. Sarcoidosis      PLAN:  In order of problems listed above:  1.  Palpitations/nonsustained atrial tachycardia -event monitor showed PVCs, nonsustained atrial tachycardia and wide complex tachycardia up to 5 beats -her amlodipine was stopped she was started on Cardizem CD 180mg   -2D echo and ETT was normal -had worsening palpitations and just completed another monitor which is pending -I will decide on further plan pending results of that monitor  2.  Chest pain -this is atypical in that it only occurs at night and only occurs when she gets palpitations  -she does have a fm hx of CAD but later age in life -EKG is normal -ETT showed no ischemia -seems to be resolved  3.  Wide complex tachycardia and PVCs -2D echo with normal LVF -cardiac MRI with mid wall LGE ? Myocarditis vs sarcoid -she continues to have palpitations and repeat ziopatch is pending -I have reviewed the findings of the cardiac MRI with her.  My main concern is the ventricular arrhythmias and etiology of these in setting of abnormal cardiac MRI.  ? Whether PET scan would help differentiate sarcoid from old  myocarditis scar -I will refer to Dr. Aundra Dubin for further guidance  Medication Adjustments/Labs and Tests Ordered: Current medicines are reviewed at length with the patient today.  Concerns regarding medicines are outlined above.  Medication changes, Labs and Tests ordered today are listed in the Patient Instructions below.  Patient Instructions  We will call you once we receive the heart monitor results and make any changes that are needed at that time.   Medication Instructions:  Your physician recommends that you continue on your current medications as directed. Please refer to the Current Medication list given to you today.  *If you need a refill on your cardiac medications before your next appointment, please call your pharmacy*  Follow-Up: At [facility], you and your health needs are our priority.  As part of our continuing mission to provide you with exceptional heart care, we have created designated Provider Care Teams.  These Care Teams include your primary Cardiologist (physician) and Advanced Practice Providers (APPs -  Physician Assistants and Nurse Practitioners) who all work together to provide you with the care you need, when you need it.  Your next appointment:   6 month(s)  The format for your next appointment:   In Person  Provider:   You may see Taylor Him, MD or one of the following Advanced Practice Providers on your designated Care Team:    Melina Copa, PA-C  Ermalinda Barrios, PA-C  Other Instructions You have been referred to see Dr. Aundra Dubin     Signed, Taylor Him, MD  10/06/2020 2:09 PM    [address], [place], [place]  [address] Phone: [phone]; Fax: [phone]

## 2020-10-06 NOTE — Patient Instructions (Signed)
We will call you once we receive the heart monitor results and make any changes that are needed at that time.   Medication Instructions:  Your physician recommends that you continue on your current medications as directed. Please refer to the Current Medication list given to you today.  *If you need a refill on your cardiac medications before your next appointment, please call your pharmacy*  Follow-Up: At [facility], you and your health needs are our priority.  As part of our continuing mission to provide you with exceptional heart care, we have created designated Provider Care Teams.  These Care Teams include your primary Cardiologist (physician) and Advanced Practice Providers (APPs -  Physician Assistants and Nurse Practitioners) who all work together to provide you with the care you need, when you need it.  Your next appointment:   6 month(s)  The format for your next appointment:   In Person  Provider:   You may see Fransico Him, MD or one of the following Advanced Practice Providers on your designated Care Team:    Melina Copa, PA-C  Ermalinda Barrios, PA-C  Other Instructions You have been referred to see Dr. Aundra Dubin

## 2020-10-08 ENCOUNTER — Telehealth: Payer: Self-pay | Admitting: Cardiology

## 2020-10-08 MED ORDER — METOPROLOL SUCCINATE ER 25 MG PO TB24: 25.0000 mg | ORAL_TABLET | Freq: Every day | ORAL | 3 refills | Status: DC

## 2020-10-08 NOTE — Telephone Encounter (Signed)
The patient has been notified of the result and verbalized understanding.  All questions (if any) were answered.  Toprol XL 25 mg daily sent to pharmacy.  New appt made with D. Dunn, PA on 11/12/19 10:45am Patient made aware to call back with any new signs/symtpoms.   Reviewed low BP/HR parameters for patient when starting Toprol. She verbalized understanding and is aware to call with any concerns.  Wilma Flavin, RN 10/08/2020 9:48 AM

## 2020-10-08 NOTE — Telephone Encounter (Signed)
Follow Up:    Pt is returning Taylor Alvarado's call from yesterday.

## 2020-10-14 ENCOUNTER — Telehealth: Payer: Self-pay | Admitting: Cardiology

## 2020-10-14 ENCOUNTER — Encounter: Payer: Self-pay | Admitting: Cardiology

## 2020-10-14 NOTE — Telephone Encounter (Signed)
Spoke with patient regarding scheduled appointment Tuesday 11/16/20 for the Cardiac MRI ordered by Dr. Dicie Beam time is 11:30 am 1st floor admissions office at [facility] for a 12:00pm appointment .  Will mail infromation to patient and it is also in [place].  Patient voiced her understanding.

## 2020-11-09 ENCOUNTER — Telehealth: Payer: Self-pay | Admitting: Physician Assistant

## 2020-11-09 ENCOUNTER — Encounter: Payer: Self-pay | Admitting: Physician Assistant

## 2020-11-09 DIAGNOSIS — R000 Tachycardia, unspecified: Secondary | ICD-10-CM | POA: Insufficient documentation

## 2020-11-09 DIAGNOSIS — I472 Ventricular tachycardia: Secondary | ICD-10-CM | POA: Insufficient documentation

## 2020-11-09 NOTE — Telephone Encounter (Signed)
As long as she has access to video - cannot do telephone visit

## 2020-11-09 NOTE — Telephone Encounter (Signed)
That's fine as long as no issues with any CP, SOB, passing out that need in person evaluation

## 2020-11-09 NOTE — Telephone Encounter (Signed)
Returned call to pt.  She denies any cp, sob, syncope episodes.  Based upon that, pt is a candidate for a mychart video visit. Pt has been made aware.  She does have a way to check her bp at home. Pt gave verbal consent.    Patient Consent for Virtual Visit         Taylor Alvarado has provided verbal consent on 11/09/2020 for a virtual visit (video or telephone).   CONSENT FOR VIRTUAL VISIT FOR:  Taylor Alvarado  By participating in this virtual visit I agree to the following:  I hereby voluntarily request, consent and authorize [place] and its employed or contracted physicians, physician assistants, nurse practitioners or other licensed health care professionals (the Practitioner), to provide me with telemedicine health care services (the "Services") as deemed necessary by the treating Practitioner. I acknowledge and consent to receive the Services by the Practitioner via telemedicine. I understand that the telemedicine visit will involve communicating with the Practitioner through live audiovisual communication technology and the disclosure of certain medical information by electronic transmission. I acknowledge that I have been given the opportunity to request an in-person assessment or other available alternative prior to the telemedicine visit and am voluntarily participating in the telemedicine visit.  I understand that I have the right to withhold or withdraw my consent to the use of telemedicine in the course of my care at any time, without affecting my right to future care or treatment, and that the Practitioner or I may terminate the telemedicine visit at any time. I understand that I have the right to inspect all information obtained and/or recorded in the course of the telemedicine visit and may receive copies of available information for a reasonable fee.  I understand that some of the potential risks of receiving the Services via telemedicine include:  Marland Kitchen Delay or interruption in  medical evaluation due to technological equipment failure or disruption; . Information transmitted may not be sufficient (e.g. poor resolution of images) to allow for appropriate medical decision making by the Practitioner; and/or  . In rare instances, security protocols could fail, causing a breach of personal health information.  Furthermore, I acknowledge that it is my responsibility to provide information about my medical history, conditions and care that is complete and accurate to the best of my ability. I acknowledge that Practitioner's advice, recommendations, and/or decision may be based on factors not within their control, such as incomplete or inaccurate data provided by me or distortions of diagnostic images or specimens that may result from electronic transmissions. I understand that the practice of medicine is not an exact science and that Practitioner makes no warranties or guarantees regarding treatment outcomes. I acknowledge that a copy of this consent can be made available to me via my patient portal ([facility]), or I can request a printed copy by calling the office of [facility].    I understand that my insurance will be billed for this visit.   I have read or had this consent read to me. . I understand the contents of this consent, which adequately explains the benefits and risks of the Services being provided via telemedicine.  . I have been provided ample opportunity to ask questions regarding this consent and the Services and have had my questions answered to my satisfaction. . I give my informed consent for the services to be provided through the use of telemedicine in my medical care   3

## 2020-11-09 NOTE — Progress Notes (Addendum)
Virtual Visit via Video Note   This visit type was conducted due to national recommendations for restrictions regarding the COVID-19 Pandemic (e.g. social distancing) in an effort to limit this patient's exposure and mitigate transmission in our community.  Due to her co-morbid illnesses, this patient is at least at moderate risk for complications without adequate follow up.  This format is felt to be most appropriate for this patient at this time.  All issues noted in this document were discussed and addressed.  A limited physical exam was performed with this format.  Please refer to the patient's chart for her consent to telehealth for [facility]. The patient was identified using 2 identifiers.  Date:  11/11/2020   ID:  Herb Grays, DOB February 11, 1956, MRN [id number]  Patient Location: Home Provider Location: Office/Clinic  PCP:  Adrian Prince, MD  Cardiologist:  Armanda Magic, MD  Electrophysiologist:  None   Evaluation Performed:  Follow-Up Visit  Chief Complaint:  F/u palpitations  History of Present Illness:    Shiraz Hieronymus is a 65 y.o. female with GERD, HLD (not managed by our office), hypothyroidism, OSA on treatment (followed by neuro), atrial tachycardia, PVCs, PACs, wide complex tachycardia,  and abnormal cMRI who presents for follow-up.  She has a long history of palpitations going back about 6 years but they were very rare back then. She has more recently has been more aware of them. She established care with Dr. Mayford Knife and had event monitor in 03/2020 showing NSR, occasional PVCs, WCT for 5 beats, and nonsustained atrial tachycardia. Amlodipine was changed to Cardizem. 2D echo showed trivial MR and normal LVF. ETT showed no ischemia but with moderately decreased exercise capacity. cMRI 07/2020 was done for ventricular ectopy which showed showed basal inferolateral midwall LGE c/w scar seen in nonischemic DCM such as myocarditis or sarcoidosis, normal LVF with EF 61% and  normal RVF with EF 58%. She did not recall any preceding illnesses. She has a referral to Dr. Shirlee Latch pending for this finding. She was recently seen back for continued sporadic palpitations with repeat monitor showing NSR, nonsustained WBC up to 4 beats, PVCs, ventricular couplets with PVC load <1%, PAT, and frequent PACs. Toprol was added with plan for follow-up.  She is seen back for follow-up today. She reports symptoms occur exclusively at night (palpitations/chest pressure) waking her up in the middle of the night - from the report provided, her symptom triggers on the monitor were actually associated with NSR rather than with the observed ectopy. She states she is absolutely asymptomatic from a cardiac standpoint during the day without any angina or dyspnea with exertion. However, her activity is greatly limited by hip bursitis and she does not really exert herself. Metoprolol has helped decrease the frequency only marginally. It did decrease the duration of these symptoms down to about a minute (from several minutes) but did not totally resolve them. She has noticed some mild fatigue since starting BB but is otherwise is asymptomatic during the day without any other concerning symptoms. She is compliant with CPAP and reports following regularly with neurology for this. Regarding family history, father had bypass in his mid 63s and lived into his 31s.  Labs Independently Reviewed [facility] 01/2020 LDL 127, Hgb 14.4, Cr 0.8, TSH 2.270 (obtained at a time she was having these symptoms) None on file through our system  Past Medical History:  Diagnosis Date  . Arthritis   . GERD (gastroesophageal reflux disease)   . High blood pressure 2020  .  Hyperlipidemia   . Hypothyroidism   . Migraine    diagnosed at 63, [facility].  . Morning headache 02/20/2019  . On home oxygen therapy 03/01/2020  . OSA on CPAP 03/01/2020  . Osteoarthritis   . PAT (paroxysmal atrial tachycardia) (HCC)   .  Premature atrial contractions   . PVC's (premature ventricular contractions)   . Sleep-related hypoxia 03/01/2020  . Snoring 02/20/2019  . Unexplained night sweats 02/20/2019  . Wide-complex tachycardia (HCC)    Past Surgical History:  Procedure Laterality Date  . CESAREAN SECTION  1983, 1987  . TONSILLECTOMY  1966  . TUBAL LIGATION  1995     Current Meds  Medication Sig  . diltiazem (CARDIZEM CD) 180 MG 24 hr capsule Take 1 capsule (180 mg total) by mouth daily.  . metoprolol succinate (TOPROL-XL) 25 MG 24 hr tablet Take 1 tablet (25 mg total) by mouth daily. Take with or immediately following a meal.  . olmesartan (BENICAR) 40 MG tablet Take 40 mg by mouth daily.   . RESTASIS 0.05 % ophthalmic emulsion Place 1 drop into both eyes daily.   . rosuvastatin (CRESTOR) 10 MG tablet Take 10 mg by mouth once a week.   Marland Kitchen SYNTHROID 88 MCG tablet TK 1 T PO QD  . Vitamin D, Cholecalciferol, 25 MCG (1000 UT) TABS Take 1,000 Units by mouth daily.     Allergies:   Penicillins, Septra [sulfamethoxazole-trimethoprim], and Shellfish allergy   Social History   Tobacco Use  . Smoking status: Never Smoker  . Smokeless tobacco: Never Used  Vaping Use  . Vaping Use: Never used  Substance Use Topics  . Alcohol use: Never  . Drug use: Never     Family Hx: The patient's family history includes Bone cancer in her paternal grandmother; Breast cancer in her maternal grandmother; Diabetes type I in her brother; Diabetes type II in her father; Headache in her mother and sister; Heart disease in her brother and father. There is no history of Colon cancer.  ROS:   Please see the history of present illness.    + hip bursitis chronically All other systems reviewed and are negative.   Prior CV studies:   The following studies were reviewed today:  Event Monitor 09/2020   Sinus bradycardia, normal sinus rhythm and sinus tachycardia with average heart rate 78bpm and ranged from 45 to  135bpm.  Nonsustained wide complex tachycardia up to 4 beats, PVCs, ventricular couplets. PVC load < 1%.  Nonsustained atrial tachycardia up to 17 beats in a row and frequent PACs.    2D Echo 04/06/20 1. Left ventricular ejection fraction, by estimation, is 60 to 65%. The  left ventricle has normal function. The left ventricle has no regional  wall motion abnormalities. Left ventricular diastolic parameters are  consistent with Grade I diastolic  dysfunction (impaired relaxation). The average left ventricular global  longitudinal strain is -23.7 %. The global longitudinal strain is normal.  2. Right ventricular systolic function is normal. The right ventricular  size is normal. There is normal pulmonary artery systolic pressure.  3. The mitral valve is normal in structure. Trivial mitral valve  regurgitation. No evidence of mitral stenosis.  4. The aortic valve is tricuspid. Aortic valve regurgitation is not  visualized. No aortic stenosis is present.  5. The inferior vena cava is normal in size with greater than 50%  respiratory variability, suggesting right atrial pressure of 3 mmHg.   Event Monitor 03/2020  Sinus  bradycardia, normal sinus rhythm and sinus tachycardia. The average heart rate was 78bpm and ranged from 49 to 142bpm.  Occasional PVCs.  Wide complex tachycardia for 5 beats.   Nonsustained atrial tachycardia up to 14 beats.  ETT 03/2020    Blood pressure demonstrated a normal response to exercise.  There was no ST segment deviation noted during stress.   No ischemia and normal BP response to exercise. Moderately decreased exercise capacity, the patient was only able to exercise for 3 minutes and 12 seconds.      Labs/Other Tests and Data Reviewed:    EKG:  An ECG dated 03/15/20 was personally reviewed today and demonstrated:  NSR 90bpm without acute changes, QTc  Recent Labs: No results found for requested labs within last 8760 hours.    Recent Lipid Panel No results found for: CHOL, TRIG, HDL, CHOLHDL, LDLCALC, LDLDIRECT  Wt Readings from Last 3 Encounters:  11/11/20 203 lb (92.1 kg)  10/06/20 206 lb 12.8 oz (93.8 kg)  09/09/20 203 lb (92.1 kg)     Objective:    Vital Signs:  BP 123/77   Pulse 61   Ht 5\' 3"  (1.6 m)   Wt 203 lb (92.1 kg)   BMI 35.96 kg/m    VS reviewed. General - calm F in no acute distress HEENT - NCAT, EOM intact Pulm - No labored breathing, no coughing during visit, no audible wheezing, speaking in full sentences Neuro - A+Ox3, no slurred speech, answers questions appropriately Psych - Pleasant affect  ASSESSMENT & PLAN:    1. Palpitations/chest pressure, also event monitor findings of PAT, WCT, PACs, PVCs - interestingly her most recent event monitor showed symptoms of chest pressure were not actually correlated with the observed ectopy. She did notice some improvement with metoprolol but not total resolution. Her baseline HR precludes further med titration. Would continue present regimen currently which includes diltiazem as well. I will reach out to Dr. Mayford Knife to discuss her case since her symptoms did not specifically correlate with arrhythmia. She does report good control of OSA to her awareness. She had a nonischemic ETT earlier this year, but with moderately decreased exercise tolerance. Will also try to get a copy of labs from PCP from 01/2020 to have on file. Addendum: I discussed her monitor results more in depth with Dr. Mayford Knife. It remains unclear exactly why she's feeling these palpitations/pressure waking her from sleep even though she is in normal rhythm. There could be some relationship to OSA/CPAP but the one thing we sometimes think about needing to exclude fully is development of heart blockage causing angina decubitus, especially given her family history with her dad having premature heart disease. Dr. Mayford Knife recommends arranging coronary CT scan for more definitive exclusion of  coronary obstruction and I agree with this. Since we will need updated updated BMET for CT, will also get CBC, TSH, and Mg for palpitations/chest pressure. See phone note.  2. Abnormal cMRI - as outlined above. Per chart review the initial plan was to repeat this 3 months after the first one but this was deferred per patient in her discussion with Dr. Mayford Knife. Awaiting referral to Dr. Shirlee Latch - we confirmed this remains in process at this time.  3. OSA - followed by neurology with compliance with follow-up per patient.  4. Hypothyroidism - recent TSH in 01/2020 was normal. Will see if we can request a full copy of the labs done at that time.   Time:   Today, I have  spent 13 minutes with the patient with telehealth technology discussing the above problems.     Medication Adjustments/Labs and Tests Ordered: Current medicines are reviewed at length with the patient today.  Testing and concerns regarding medicines are outlined above.    Follow Up: AHF referral pending; f/u Dr. Mayford Knife in 3 months otherwise  Signed, Laurann Montana, PA-C  11/11/2020 11:18 AM    [facility]

## 2020-11-09 NOTE — Telephone Encounter (Signed)
New message:     Patient would like to know if she can do a VV on next apt.

## 2020-11-11 ENCOUNTER — Encounter: Payer: Self-pay | Admitting: *Deleted

## 2020-11-11 ENCOUNTER — Telehealth (INDEPENDENT_AMBULATORY_CARE_PROVIDER_SITE_OTHER): Payer: Medicare PPO | Admitting: Physician Assistant

## 2020-11-11 ENCOUNTER — Telehealth: Payer: Self-pay | Admitting: Physician Assistant

## 2020-11-11 ENCOUNTER — Encounter: Payer: Self-pay | Admitting: Physician Assistant

## 2020-11-11 VITALS — BP 123/77 | HR 61 | Ht 63.0 in | Wt 203.0 lb

## 2020-11-11 DIAGNOSIS — E039 Hypothyroidism, unspecified: Secondary | ICD-10-CM

## 2020-11-11 DIAGNOSIS — R000 Tachycardia, unspecified: Secondary | ICD-10-CM

## 2020-11-11 DIAGNOSIS — R0989 Other specified symptoms and signs involving the circulatory and respiratory systems: Secondary | ICD-10-CM

## 2020-11-11 DIAGNOSIS — I471 Supraventricular tachycardia: Secondary | ICD-10-CM

## 2020-11-11 DIAGNOSIS — I493 Ventricular premature depolarization: Secondary | ICD-10-CM

## 2020-11-11 DIAGNOSIS — R0789 Other chest pain: Secondary | ICD-10-CM

## 2020-11-11 DIAGNOSIS — I472 Ventricular tachycardia: Secondary | ICD-10-CM

## 2020-11-11 DIAGNOSIS — I491 Atrial premature depolarization: Secondary | ICD-10-CM | POA: Diagnosis not present

## 2020-11-11 DIAGNOSIS — G4733 Obstructive sleep apnea (adult) (pediatric): Secondary | ICD-10-CM

## 2020-11-11 MED ORDER — METOPROLOL TARTRATE 50 MG PO TABS: ORAL_TABLET | ORAL | 0 refills | Status: DC

## 2020-11-11 NOTE — Telephone Encounter (Signed)
   Please let pt know I discussed her monitor results more in depth with Dr. Radford Pax. It remains unclear exactly why she's feeling these palpitations/pressure waking her from sleep even though she is in normal rhythm. There could be some relationship to CPAP but the one thing we sometimes think about needing to exclude fully is development of heart blockage especially given her family history with her dad having premature heart disease. Dr. Radford Pax recommends arranging coronary CT scan for more definitive exclusion of heart blockages and I agree with this. This is a special type of CT scan to look at her heart arteries to look for plaque. She is already on beta blocker and diltiazem with HR in the 60s. On the day of the procedure have her take her diltiazem but instead of Toprol let's do Lopressor 50mg  x 1 (home HR 55-61 per her report) pre-test. Since she will need an updated BMET for this, please get an updated magnesium and TSH as well (under diagnosis of palpitations/chest pressure).  Thanks! Pio Eatherly

## 2020-11-11 NOTE — Patient Instructions (Addendum)
Medication Instructions:  Your physician recommends that you continue on your current medications as directed. Please refer to the Current Medication list given to you today.  *If you need a refill on your cardiac medications before your next appointment, please call your pharmacy*   Lab Work: None ordered  If you have labs (blood work) drawn today and your tests are completely normal, you will receive your results only by: Marland Kitchen MyChart Message (if you have MyChart) OR . A paper copy in the mail If you have any lab test that is abnormal or we need to change your treatment, we will call you to review the results.   Testing/Procedures: None ordered   Follow-Up: At [facility], you and your health needs are our priority.  As part of our continuing mission to provide you with exceptional heart care, we have created designated Provider Care Teams.  These Care Teams include your primary Cardiologist (physician) and Advanced Practice Providers (APPs -  Physician Assistants and Nurse Practitioners) who all work together to provide you with the care you need, when you need it.  We recommend signing up for the patient portal called "MyChart".  Sign up information is provided on this After Visit Summary.  MyChart is used to connect with patients for Virtual Visits (Telemedicine).  Patients are able to view lab/test results, encounter notes, upcoming appointments, etc.  Non-urgent messages can be sent to your provider as well.   To learn more about what you can do with MyChart, go to [email or web address].    Your next appointment:   3 month(s)  02/14/2021 ARRIVE AT 11:00 TO SEE DR. Radford Pax   The format for your next appointment:   In Person  Provider:   You may see Fransico Him, MD or one of the following Advanced Practice Providers on your designated Care Team:    Melina Copa, PA-C  Ermalinda Barrios, PA-C    Other Instructions Will follow up on your referral to [facility] and  hopefully someone will call you with an appointment

## 2020-11-11 NOTE — Addendum Note (Signed)
Addended by: Gaetano Net on: 11/11/2020 02:58 PM   Modules accepted: Orders

## 2020-11-12 NOTE — Telephone Encounter (Signed)
Pt has been made aware of the recommendations below.  See office note for orders.

## 2020-11-16 ENCOUNTER — Other Ambulatory Visit (HOSPITAL_COMMUNITY): Payer: Medicare PPO

## 2020-11-21 ENCOUNTER — Other Ambulatory Visit: Payer: Self-pay | Admitting: Cardiology

## 2020-11-30 ENCOUNTER — Telehealth: Payer: Self-pay | Admitting: *Deleted

## 2020-11-30 NOTE — Telephone Encounter (Signed)
-----   Message from Melony Overly sent at 11/26/2020  9:54 AM EST ----- Regarding: ct heart Scheduled 12/07/20 at 2:45  Pt will need labs done   Thanks, [place]

## 2020-11-30 NOTE — Telephone Encounter (Signed)
Placed call to pt regarding needing labs for her upcoming Cardiac CT. Left a detailed message that she can come in tomorrow, 12/01/2020, anytime after 7:30 and before 4:30 and if she needed to reschedule, to give the office a call back.

## 2020-12-01 ENCOUNTER — Other Ambulatory Visit: Payer: Self-pay

## 2020-12-01 ENCOUNTER — Other Ambulatory Visit: Payer: Medicare PPO | Admitting: *Deleted

## 2020-12-01 DIAGNOSIS — I471 Supraventricular tachycardia: Secondary | ICD-10-CM

## 2020-12-01 DIAGNOSIS — R000 Tachycardia, unspecified: Secondary | ICD-10-CM

## 2020-12-01 DIAGNOSIS — I472 Ventricular tachycardia: Secondary | ICD-10-CM

## 2020-12-01 DIAGNOSIS — E039 Hypothyroidism, unspecified: Secondary | ICD-10-CM

## 2020-12-01 DIAGNOSIS — I493 Ventricular premature depolarization: Secondary | ICD-10-CM

## 2020-12-01 DIAGNOSIS — R0989 Other specified symptoms and signs involving the circulatory and respiratory systems: Secondary | ICD-10-CM

## 2020-12-01 DIAGNOSIS — G4733 Obstructive sleep apnea (adult) (pediatric): Secondary | ICD-10-CM

## 2020-12-01 DIAGNOSIS — R0789 Other chest pain: Secondary | ICD-10-CM

## 2020-12-01 DIAGNOSIS — I491 Atrial premature depolarization: Secondary | ICD-10-CM

## 2020-12-01 LAB — TSH: TSH: 1.9 u[IU]/mL (ref 0.450–4.500)

## 2020-12-01 LAB — BASIC METABOLIC PANEL
BUN/Creatinine Ratio: 24 (ref 12–28)
BUN: 21 mg/dL (ref 8–27)
CO2: 25 mmol/L (ref 20–29)
Calcium: 9.7 mg/dL (ref 8.7–10.3)
Chloride: 105 mmol/L (ref 96–106)
Creatinine, Ser: 0.87 mg/dL (ref 0.57–1.00)
GFR calc Af Amer: 81 mL/min/{1.73_m2} (ref >59)
GFR calc non Af Amer: 70 mL/min/{1.73_m2} (ref >59)
Glucose: 95 mg/dL (ref 65–99)
Potassium: 4.4 mmol/L (ref 3.5–5.2)
Sodium: 141 mmol/L (ref 134–144)

## 2020-12-01 LAB — MAGNESIUM: Magnesium: 1.8 mg/dL (ref 1.6–2.3)

## 2020-12-02 ENCOUNTER — Telehealth: Payer: Self-pay | Admitting: *Deleted

## 2020-12-02 MED ORDER — MAGNESIUM OXIDE 400 (241.3 MG) MG PO TABS: 400.0000 mg | ORAL_TABLET | Freq: Every day | ORAL | Status: DC

## 2020-12-02 NOTE — Telephone Encounter (Signed)
-----   Message from Charlie Pitter, [place] sent at 12/01/2020  4:49 PM EST ----- Please let pt know labs all look normal except magnesium is slightly below goal of where we'd like to see it - still normal though. I would suggest she add Magnesium supplement in the form of Magnesium Oxide 400mg  daily. Can pick up OTC or send in through prescription. Keep plan otherwise as discussed with CT and followup CHF clinic.

## 2020-12-06 ENCOUNTER — Telehealth (HOSPITAL_COMMUNITY): Payer: Self-pay | Admitting: Emergency Medicine

## 2020-12-06 NOTE — Telephone Encounter (Signed)
Reaching out to patient to offer assistance regarding upcoming cardiac imaging study; pt verbalizes understanding of appt date/time, parking situation and where to check in, pre-test NPO status and medications ordered, and verified current allergies; name and call back number provided for further questions should they arise [name] RN Navigator Cardiac [facility] [phone] office [phone] cell   Pt hesitant to take PO 50mg  metoprolol tartrate. States her resting HR is low to begin with on daily meds. I told her to not take toprol xl the day of the test, but at 11:30 she will check her HR and if < 60bpm she will not need to take additional meds, if >60bpm she will take 25 or 50mg  metoprolol tartrate (whatever dose she is comfortable with taking). Pt verbalized understanding. [name]

## 2020-12-07 ENCOUNTER — Other Ambulatory Visit: Payer: Self-pay

## 2020-12-07 ENCOUNTER — Ambulatory Visit (HOSPITAL_COMMUNITY)
Admission: RE | Admit: 2020-12-07 | Discharge: 2020-12-07 | Disposition: A | Discharge status: Home / Self Care | Payer: Medicare PPO | Source: Ambulatory Visit | Attending: Physician Assistant | Admitting: Physician Assistant

## 2020-12-07 DIAGNOSIS — R0789 Other chest pain: Secondary | ICD-10-CM

## 2020-12-07 DIAGNOSIS — I2510 Atherosclerotic heart disease of native coronary artery without angina pectoris: Secondary | ICD-10-CM | POA: Diagnosis not present

## 2020-12-07 MED ORDER — NITROGLYCERIN 0.4 MG SL SUBL: 0.8000 mg | SUBLINGUAL_TABLET | Freq: Once | SUBLINGUAL | Status: DC

## 2020-12-07 MED ORDER — NITROGLYCERIN 0.4 MG SL SUBL
SUBLINGUAL_TABLET | SUBLINGUAL | Status: AC
  Filled 2020-12-07: qty 2

## 2020-12-07 MED ORDER — IOHEXOL 350 MG/ML SOLN
80.0000 mL | Freq: Once | INTRAVENOUS | Status: AC | PRN
  Administered 2020-12-07: 80 mL via INTRAVENOUS

## 2020-12-09 ENCOUNTER — Other Ambulatory Visit: Payer: Self-pay | Admitting: *Deleted

## 2020-12-09 MED ORDER — MAGNESIUM OXIDE 400 (241.3 MG) MG PO TABS: 400.0000 mg | ORAL_TABLET | Freq: Every day | ORAL | 3 refills | Status: DC

## 2021-02-08 ENCOUNTER — Other Ambulatory Visit: Payer: Self-pay

## 2021-02-08 ENCOUNTER — Ambulatory Visit (HOSPITAL_COMMUNITY)
Admission: RE | Admit: 2021-02-08 | Discharge: 2021-02-08 | Disposition: A | Discharge status: Home / Self Care | Payer: Medicare PPO | Source: Ambulatory Visit | Attending: Cardiology | Admitting: Cardiology

## 2021-02-08 ENCOUNTER — Encounter (HOSPITAL_COMMUNITY): Payer: Self-pay | Admitting: Cardiology

## 2021-02-08 VITALS — BP 140/88 | HR 73 | Wt 209.6 lb

## 2021-02-08 DIAGNOSIS — I493 Ventricular premature depolarization: Secondary | ICD-10-CM | POA: Diagnosis not present

## 2021-02-08 DIAGNOSIS — I471 Supraventricular tachycardia: Secondary | ICD-10-CM | POA: Insufficient documentation

## 2021-02-08 DIAGNOSIS — G4733 Obstructive sleep apnea (adult) (pediatric): Secondary | ICD-10-CM | POA: Insufficient documentation

## 2021-02-08 DIAGNOSIS — E785 Hyperlipidemia, unspecified: Secondary | ICD-10-CM | POA: Insufficient documentation

## 2021-02-08 DIAGNOSIS — Z79899 Other long term (current) drug therapy: Secondary | ICD-10-CM | POA: Diagnosis not present

## 2021-02-08 DIAGNOSIS — D869 Sarcoidosis, unspecified: Secondary | ICD-10-CM

## 2021-02-08 DIAGNOSIS — I429 Cardiomyopathy, unspecified: Secondary | ICD-10-CM | POA: Diagnosis present

## 2021-02-08 MED ORDER — METOPROLOL SUCCINATE ER 50 MG PO TB24: 50.0000 mg | ORAL_TABLET | Freq: Every day | ORAL | 3 refills | Status: DC

## 2021-02-08 NOTE — Progress Notes (Addendum)
PCP: Reynold Bowen, MD Cardiology: Dr. Radford Pax HF Cardiology: Dr. Aundra Dubin  65 y.o. with OSA and arrhythmias (atrial tachycardia, PVCs/NSVT) was referred to CHF clinic by Dr. Radford Pax for evaluation of abnormal cardiac MRI.  For about 6 years, patient has had episodes where she will wake up at night feeling palpitations and chest pressure. This has gradually worsened over time.  It now happens several times a month. She has been diagnosed with OSA and uses CPAP, but this has not stopped the symptoms. She does not have exertional dyspnea or chest pain.  She does not tend to feel palpitations during the day.  Echo in 6/21 showed EF 60-65%.  She has been noted to have both atrial and ventricular arrhythmias.  Last Zio patch in 12/21 showed NSVT up to 4 beats, < 1% PVCs, atrial tachycardia up to 17 beats, frequent PACs.  Coronary CTA showed no significant coronary disease.  With arrhythmias, cardiac MRI was done.  This showed LV EF 61%, RV EF 58%, basal inferolateral LGE with associated elevated T2 signal => concern for myocarditis versus sarcoidosis.  She has no family history of sarcoidosis.  She had a severe flu-like illness for several weeks in 3/18.   ECG (personally reviewed): NSR, poor RWP  PMH: 1. Hypothyroidism 2. Hyperlipidemia 3. GERD 4. OSA: Uses CPAP 5. Migraines 6. Arrhythmias: History of atrial tachycardia and PVCs. - Zio patch (12/21): NSVT up to 4 beats, < 1% PVCs, atrial tachycardia up to 17 beats, frequent PACs.  - Echo (6/21): EF 60-65%, normal RV.  - cardiac MRI (10/21): LV EF 61%, RV EF 58%, basal inferolateral LGE with associated elevated T2 signal => concern for myocarditis versus sarcoidosis.  - Coronary CTA (2/22): CAC 2 Agatston units, minimal atherosclerosis, no lymphadenopathy noted and normal lung fields.   FH: No cardiomyopathy, no sarcoidosis.  Father with CABG in his 63s.   Social History   Socioeconomic History  . Marital status: Married    Spouse name: Not on file   . Number of children: 2  . Years of education: Not on file  . Highest education level: Bachelor's degree (e.g., BA, AB, BS)  Occupational History  . 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 Smoking status: Never Smoker  . Smokeless tobacco: Never Used  Vaping Use  . Vaping Use: Never used  Substance and Sexual Activity  . Alcohol use: Never  . Drug use: Never  . Sexual activity: Not on file  Other Topics Concern  . Not on file  Social History Narrative   Lives at home with husband   Right handed   Caffeine: drinks tea at all meals   Social Determinants of Health   Financial Resource Strain: Not on file  Food Insecurity: Not on file  Transportation Needs: Not on file  Physical Activity: Not on file  Stress: Not on file  Social Connections: Not on file  Intimate Partner Violence: Not on file    ROS: All systems reviewed and negative except as per HPI.   Current Outpatient Medications  Medication Sig Dispense Refill  . diltiazem (CARDIZEM CD) 180 MG 24 hr capsule TAKE 1 CAPSULE(180 MG) BY MOUTH DAILY 90 capsule 3  . magnesium oxide (MAGOX 400) 400 (241.3 Mg) MG tablet Take 1 tablet (400 mg total) by mouth daily. 90 tablet 3  . metoprolol succinate (TOPROL-XL) 50 MG 24 hr tablet Take 1 tablet (50 mg total) by mouth at bedtime. Take with  or immediately following a meal. 90 tablet 3  . olmesartan (BENICAR) 40 MG tablet Take 40 mg by mouth daily.     . RESTASIS 0.05 % ophthalmic emulsion Place 1 drop into both eyes daily.     . rosuvastatin (CRESTOR) 10 MG tablet Take 10 mg by mouth once a week.     Marland Kitchen SYNTHROID 88 MCG tablet TK 1 T PO QD    . Vitamin D, Cholecalciferol, 25 MCG (1000 UT) TABS Take 1,000 Units by mouth daily.     No current facility-administered medications for this encounter.   BP 140/88   Pulse 73   Wt 95.1 kg (209 lb 9.6 oz)   SpO2 96%   BMI 37.13 kg/m  General: NAD Neck: No JVD, no thyromegaly or thyroid  nodule.  Lungs: Clear to auscultation bilaterally with normal respiratory effort. CV: Nondisplaced PMI.  Heart regular S1/S2, no S3/S4, no murmur.  Trace ankle edema.  No carotid bruit.  Normal pedal pulses.  Abdomen: Soft, nontender, no hepatosplenomegaly, no distention.  Skin: Intact without lesions or rashes.  Neurologic: Alert and oriented x 3.  Psych: Normal affect. Extremities: No clubbing or cyanosis.  HEENT: Normal.   Assessment/Plan: 1. Arrhythmia: Patient has occasional short NSVT runs, also runs of atrial tachycardia and frequent PACs.  She wakes up at night with palpitations and chest pressure, though symptoms were not clearly correlated to arrhythmias while she was wearing the Zio patch.  Cardiac MRI showed LV EF 61%, RV EF 58%, basal inferolateral LGE with associated elevated T2 signal => concern for myocarditis versus sarcoidosis. Coronary CTA showed no significant coronary disease.  I think that it would be reasonable to rule out cardiac sarcoidosis in this situation based on MRI findings and arrhythmias.  - Check ACE level.  - I will arrange for cardiac PET at [facility].  If not suggestive of cardiac sarcoidosis, more likely that MRI abnormality is due to prior myocarditis.  Would repeat MRI down the road to make sure that abnormalities are not progressive.  - With ongoing palpitations at night, I will increase Toprol XL to 50 mg daily and have her take it in the evening.  2. OSA: Continue CPAP.   Followup in 3 months.   Loralie Champagne 02/08/2021

## 2021-02-08 NOTE — Patient Instructions (Signed)
Increase Metoprolol to 50 mg every night  Labs done today, your results will be available in MyChart, we will contact you for abnormal readings.  Your provider has recommended you have a Cardiac PET Scan at [facility]. We will get this approved with your insurance company and get it scheduled for you. We will call you with the date and time and instructions. [facility] will call you to review this information the day before the test.  Your physician recommends that you schedule a follow-up appointment in: 3 months  If you have any questions or concerns before your next appointment please send [place] a message through [place] or call our office at [phone].    TO LEAVE A MESSAGE FOR THE NURSE SELECT OPTION 2, PLEASE LEAVE A MESSAGE INCLUDING: . YOUR NAME . DATE OF BIRTH . CALL BACK NUMBER . REASON FOR CALL**this is important as we prioritize the call backs  [place] AS LONG AS YOU CALL BEFORE 4:00 PM  At the [facility], you and your health needs are our priority. As part of our continuing mission to provide you with exceptional heart care, we have created designated Provider Care Teams. These Care Teams include your primary Cardiologist (physician) and Advanced Practice Providers (APPs- Physician Assistants and Nurse Practitioners) who all work together to provide you with the care you need, when you need it.   You may see any of the following providers on your designated Care Team at your next follow up: Marland Kitchen Dr Glori Bickers . Dr Loralie Champagne . Dr Vickki Muff . Darrick Grinder, NP . Lyda Jester, [place] . Audry Riles, PharmD   Please be sure to bring in all your medications bottles to every appointment.

## 2021-02-14 ENCOUNTER — Encounter: Payer: Self-pay | Admitting: Cardiology

## 2021-02-14 ENCOUNTER — Other Ambulatory Visit: Payer: Self-pay

## 2021-02-14 ENCOUNTER — Ambulatory Visit: Payer: Medicare PPO | Admitting: Cardiology

## 2021-02-14 VITALS — BP 130/76 | HR 62 | Ht 63.0 in | Wt 209.0 lb

## 2021-02-14 DIAGNOSIS — I472 Ventricular tachycardia: Secondary | ICD-10-CM | POA: Diagnosis not present

## 2021-02-14 DIAGNOSIS — I471 Supraventricular tachycardia: Secondary | ICD-10-CM

## 2021-02-14 DIAGNOSIS — R072 Precordial pain: Secondary | ICD-10-CM

## 2021-02-14 DIAGNOSIS — R000 Tachycardia, unspecified: Secondary | ICD-10-CM

## 2021-02-14 DIAGNOSIS — R9389 Abnormal findings on diagnostic imaging of other specified body structures: Secondary | ICD-10-CM

## 2021-02-14 DIAGNOSIS — I10 Essential (primary) hypertension: Secondary | ICD-10-CM

## 2021-02-14 MED ORDER — AMLODIPINE BESYLATE 5 MG PO TABS: 5.0000 mg | ORAL_TABLET | Freq: Every day | ORAL | 3 refills | Status: DC

## 2021-02-14 MED ORDER — ROSUVASTATIN CALCIUM 5 MG PO TABS: 5.0000 mg | ORAL_TABLET | Freq: Every day | ORAL | 3 refills | Status: DC

## 2021-02-14 NOTE — Addendum Note (Signed)
Addended by: Antonieta Iba on: 02/14/2021 11:54 AM   Modules accepted: Orders

## 2021-02-14 NOTE — Progress Notes (Signed)
Date:  05/27/2020   ID:  Taylor Alvarado, DOB 1955-12-10, MRN [id number]  Date:  02/14/2021   ID:  Taylor Alvarado, DOB 12/26/1955, MRN [id number]  PCP:  Reynold Bowen, MD  Cardiologist:  Fransico Him, MD   Chief Complaint  Patient presents with  . Follow-up    Abnormal cMRI, PVCs, PAT, NSVT    History of Present Illness:  Taylor Alvarado is a 65yo female with a hx of GERD, HLD, hypothyroidism, OSA on PAP (followed by Neuro) and a 5 year hx of palpitations that awaken her from sleep at night and would only occur when she was in bed at night. She was also having CP with them only at night. She underwent Sleep study showing mild OSA and is on CPAP.  Since then her HAs have improved. Her palpitations seem to be worsened with caffeine intake. She wore a heart monitor showing nonsustained atrial tachycardia/PVCs and WCT and her amlodipine was changed to Cardizem CD 180mg  daily.   She had a cath 16 years ago that was normal.   2D echo showed trivial MR and normal LVF.  ETT showed no ischemia.  She has no hx of tobacco use but dose have a fm hx of CAD and her Dad died of CAD. 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  She was referred to AHF clinic for further evaluation of abnormal cMRI and cardiac PET was recommended at [facility] to rule out sarcoid.  She was complaining of continued palpitations at her office visit with Dr. Aundra Dubin and her toprol was increased to 50mg  daily but she has not increased the dose yet.  She is here today for followup and is doing well.  She continues to have pressure at night that is associated with palpitations only at night but never has any chest pressure at any other time.  She denies any  SOB, DOE, PND, orthopnea, LE edema, dizziness or syncope. She says that the Cardizem did not affect her  palpitations and she would like to go back on amlodipine 5mg  daily and stop Cardizem and then go up on Toprol to 50mg  daily.  She is compliant with her meds and is tolerating meds with no SE.    Past Medical History:  Diagnosis Date  . Arthritis   . GERD (gastroesophageal reflux disease)   . High blood pressure 2020  . Hyperlipidemia   . Hypothyroidism   . Migraine    diagnosed at 58, [facility].  . Morning headache 02/20/2019  . On home oxygen therapy 03/01/2020  . OSA on CPAP 03/01/2020  . Osteoarthritis   . PAT (paroxysmal atrial tachycardia) ([place])   . Premature atrial contractions   . PVC's (premature ventricular contractions)   . Sleep-related hypoxia 03/01/2020  . Snoring 02/20/2019  . Unexplained night sweats 02/20/2019  . Wide-complex tachycardia ([place])     Past Surgical History:  Procedure Laterality Date  . [place]  . TONSILLECTOMY  1966  . TUBAL LIGATION  1995    Current Medications: Current Meds  Medication Sig  . diltiazem (CARDIZEM CD) 180 MG 24 hr capsule TAKE 1 CAPSULE(180 MG) BY MOUTH DAILY  . magnesium oxide (MAGOX 400) 400 (241.3 Mg) MG tablet Take 1 tablet (400 mg total) by mouth daily.  . metoprolol  succinate (TOPROL-XL) 50 MG 24 hr tablet Take 1 tablet (50 mg total) by mouth at bedtime. Take with or immediately following a meal.  . olmesartan (BENICAR) 40 MG tablet Take 40 mg by mouth daily.   . RESTASIS 0.05 % ophthalmic emulsion Place 1 drop into both eyes daily.   . rosuvastatin (CRESTOR) 10 MG tablet Take 10 mg by mouth once a week.   Marland Kitchen SYNTHROID 88 MCG tablet TK 1 T PO QD  . Vitamin D, Cholecalciferol, 25 MCG (1000 UT) TABS Take 1,000 Units by mouth daily.    Allergies:   Penicillins, Septra [sulfamethoxazole-trimethoprim], and Shellfish allergy   Social History   Socioeconomic History  . Marital status: Married    Spouse name: Not on file  . Number of children: 2  . Years of education: Not on file  .  Highest education level: Bachelor's degree (e.g., BA, AB, BS)  Occupational History  . 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 Smoking status: Never Smoker  . Smokeless tobacco: Never Used  Vaping Use  . Vaping Use: Never used  Substance and Sexual Activity  . Alcohol use: Never  . Drug use: Never  . Sexual activity: Not on file  Other Topics Concern  . Not on file  Social History Narrative   Lives at home with husband   Right handed   Caffeine: drinks tea at all meals   Social Determinants of Health   Financial Resource Strain: Not on file  Food Insecurity: Not on file  Transportation Needs: Not on file  Physical Activity: Not on file  Stress: Not on file  Social Connections: Not on file     Family History:  The patient's family history includes Bone cancer in her paternal grandmother; Breast cancer in her maternal grandmother; Diabetes type I in her brother; Diabetes type II in her father; Headache in her mother and sister; Heart disease in her brother and father.   ROS:   Please see the history of present illness.    ROS All other systems reviewed and are negative.  No flowsheet data found.   PHYSICAL EXAM:   VS:  BP 130/76   Pulse 62   Ht 5\' 3"  (1.6 m)   Wt 209 lb (94.8 kg)   BMI 37.02 kg/m     Wt Readings from Last 3 Encounters:  02/14/21 209 lb (94.8 kg)  02/08/21 209 lb 9.6 oz (95.1 kg)  11/11/20 203 lb (92.1 kg)    GEN: Well nourished, well developed in no acute distress HEENT: Normal NECK: No JVD; No carotid bruits LYMPHATICS: No lymphadenopathy CARDIAC:RRR, no murmurs, rubs, gallops RESPIRATORY:  Clear to auscultation without rales, wheezing or rhonchi  ABDOMEN: Soft, non-tender, non-distended MUSCULOSKELETAL:  No edema; No deformity  SKIN: Warm and dry NEUROLOGIC:  Alert and oriented x 3 PSYCHIATRIC:  Normal affect    Studies/Labs Reviewed:    Recent Labs: 12/01/2020: BUN 21; Creatinine, Ser 0.87;  Magnesium 1.8; Potassium 4.4; Sodium 141; TSH 1.900   Lipid Panel No results found for: CHOL, TRIG, HDL, CHOLHDL, VLDL, LDLCALC, LDLDIRECT  Additional studies/ records that were reviewed today include:  2D echo 03/2020 IMPRESSIONS    1. Left ventricular ejection fraction, by estimation, is 60 to 65%. The  left ventricle has normal function. The left ventricle has no regional  wall motion abnormalities. Left ventricular diastolic parameters are  consistent with Grade I diastolic  dysfunction (impaired relaxation). The average left  ventricular global  longitudinal strain is -23.7 %. The global longitudinal strain is normal.  2. Right ventricular systolic function is normal. The right ventricular  size is normal. There is normal pulmonary artery systolic pressure.  3. The mitral valve is normal in structure. Trivial mitral valve  regurgitation. No evidence of mitral stenosis.  4. The aortic valve is tricuspid. Aortic valve regurgitation is not  visualized. No aortic stenosis is present.  5. The inferior vena cava is normal in size with greater than 50%  respiratory variability, suggesting right atrial pressure of 3 mmHg.     ASSESSMENT:    1. PAT (paroxysmal atrial tachycardia) (HCC)   2. Wide-complex tachycardia ([place])   3. Precordial pain   4. Abnormal chest MRI   5. Primary hypertension      PLAN:  In order of problems listed above:  1.  Palpitations/nonsustained atrial tachycardia -event monitor showed PVCs, nonsustained atrial tachycardia and wide complex tachycardia up to 5 beats -her amlodipine was stopped she was started on Cardizem CD 180mg   -2D echo and ETT was normal -Cardiac MRI showed basal inferolateral midwall LGE, which is a scar pattern seen in nonischemic cardiomyopathies such as myocarditis or sarcoidosis. 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 Elevated T2 values in basal inferior/inferolateral wall suggested  myocardial edema -Toprol was increased by Dr.  Aundra Dubin due to continued palpitations at night but she did not make the dose change yet because she wanted to change her cardizem back to amlodipine since it does not help her palpitations -She will increase Toprol XL to 50mg  daily -she has not heard from [facility] regarding her PET scan yet and I told her to call Dr. Aundra Dubin in a week if she has not heard anything.   2.  Chest pain -this is atypical in that it only occurs at night and only occurs when she gets palpitations  -she does have a fm hx of CAD but later age in life -EKG is normal -ETT showed no ischemia -coronary CTA with no significant disease (0-24% dLM) -seems to be resolved  3.  Wide complex tachycardia and PVCs -2D echo with normal LVF -cardiac MRI with mid wall LGE ? Myocarditis vs sarcoid -increaseToprol XL 50mg  daily  4.  Abnormal Cardiac MRI -basal inferolateral midwall LGE, which is a scar pattern seen in nonischemic cardiomyopathies such as myocarditis or sarcoidosis. 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 Elevated T2 values in basal inferior/inferolateral wall suggests myocardial edema -referred to Dr. Aundra Dubin with AHF clinic and she is getting a cardiac PET at [facility] -it was recommended that if PET is not suggestive of cardiac sarcoidosis, more likely that MRI abnormality is due to prior myocarditis.  Would repeat MRI down the road to make sure that abnormalities are not progressive.   5.  HTN -change Cardizem to amlodipine 5mg  daily per her request -continue Toprol but at 50mg  daily  6.  HLD -LDL goal at least < 100 and preferably < 70 -she is taking Crestor 10mg  weekly -will increase Crestor to 5mg  daily -repeat FLP and ALT In 6 weeks  Medication Adjustments/Labs and Tests Ordered: Current medicines are reviewed at length with the patient today.  Concerns regarding medicines are outlined above.  Medication changes, Labs and Tests ordered today are listed in the Patient Instructions below.  There are no Patient  Instructions on file for this visit.   Signed, Fransico Him, MD  02/14/2021 11:30 AM    [place], [place]  [address] Phone: [phone]; Fax: [phone]

## 2021-02-14 NOTE — Patient Instructions (Addendum)
Medication Instructions:  Your physician has recommended you make the following change in your medication:  1) STOP taking Cardizem (diltiazem) 2) START taking Norvasc (amlodipine) 5 mg daily 3) DECREASE Crestor (rosuvastatin) to 5 mg daily  4) INCREASE Toprol XL (metoprolol succinate) to 50 mg daily *If you need a refill on your cardiac medications before your next appointment, please call your pharmacy*   Lab Work: Fasting lipid and ALT in 6 weeks If you have labs (blood work) drawn today and your tests are completely normal, you will receive your results only by: Marland Kitchen MyChart Message (if you have MyChart) OR . A paper copy in the mail If you have any lab test that is abnormal or we need to change your treatment, we will call you to review the results.  Follow-Up: At [facility] you and your health needs are our priority.  As part of our continuing mission to provide you with exceptional heart care, we have created designated Provider Care Teams.  These Care Teams include your primary Cardiologist (physician) and Advanced Practice Providers (APPs -  Physician Assistants and Nurse Practitioners) who all work together to provide you with the care you need, when you need it.  Your next appointment:   4 week(s)  The format for your next appointment:   Virtual Visit   Provider:   You may see Fransico Him, MD or one of the following Advanced Practice Providers on your designated Care Team:    Melina Copa, PA-C  Ermalinda Barrios, PA-C

## 2021-02-15 ENCOUNTER — Encounter: Payer: Self-pay | Admitting: Internal Medicine

## 2021-02-16 ENCOUNTER — Telehealth (HOSPITAL_COMMUNITY): Payer: Self-pay | Admitting: *Deleted

## 2021-02-18 NOTE — Addendum Note (Signed)
Addended by: Patterson Hammersmith A on: 02/18/2021 01:02 PM   Modules accepted: Orders

## 2021-02-23 NOTE — Telephone Encounter (Signed)
Referral form and authorization faxed to [facility] PET scan dept [phone]

## 2021-03-01 ENCOUNTER — Encounter (HOSPITAL_COMMUNITY): Payer: Self-pay

## 2021-03-24 ENCOUNTER — Telehealth: Payer: Medicare PPO | Admitting: Cardiology

## 2021-03-29 ENCOUNTER — Other Ambulatory Visit: Payer: Self-pay

## 2021-03-29 ENCOUNTER — Other Ambulatory Visit: Payer: Medicare PPO | Admitting: *Deleted

## 2021-03-29 DIAGNOSIS — R072 Precordial pain: Secondary | ICD-10-CM

## 2021-03-29 DIAGNOSIS — R9389 Abnormal findings on diagnostic imaging of other specified body structures: Secondary | ICD-10-CM

## 2021-03-29 DIAGNOSIS — I472 Ventricular tachycardia: Secondary | ICD-10-CM

## 2021-03-29 DIAGNOSIS — I4719 Other supraventricular tachycardia: Secondary | ICD-10-CM

## 2021-03-29 DIAGNOSIS — R000 Tachycardia, unspecified: Secondary | ICD-10-CM

## 2021-03-29 DIAGNOSIS — I10 Essential (primary) hypertension: Secondary | ICD-10-CM

## 2021-03-29 DIAGNOSIS — I471 Supraventricular tachycardia: Secondary | ICD-10-CM

## 2021-03-29 LAB — LIPID PANEL
Chol/HDL Ratio: 2.7 ratio (ref 0.0–4.4)
Cholesterol, Total: 181 mg/dL (ref 100–199)
HDL: 68 mg/dL (ref >39)
LDL Chol Calc (NIH): 103 mg/dL — ABNORMAL HIGH (ref 0–99)
Triglycerides: 53 mg/dL (ref 0–149)
VLDL Cholesterol Cal: 10 mg/dL (ref 5–40)

## 2021-03-29 LAB — ALT: ALT: 14 IU/L (ref 0–32)

## 2021-04-12 ENCOUNTER — Encounter (HOSPITAL_COMMUNITY): Payer: Self-pay

## 2021-04-13 ENCOUNTER — Telehealth (HOSPITAL_COMMUNITY): Payer: Self-pay | Admitting: *Deleted

## 2021-04-13 NOTE — Telephone Encounter (Signed)
High resolution chest ct authorization approved.

## 2021-04-22 ENCOUNTER — Telehealth (HOSPITAL_COMMUNITY): Payer: Self-pay | Admitting: Vascular Surgery

## 2021-04-22 ENCOUNTER — Telehealth (HOSPITAL_COMMUNITY): Payer: Self-pay | Admitting: *Deleted

## 2021-04-22 DIAGNOSIS — D869 Sarcoidosis, unspecified: Secondary | ICD-10-CM

## 2021-04-22 NOTE — Telephone Encounter (Signed)
Left pt detailed message giving CT chest appt @ [facility] Tues 6/2 @ 11:30, and lab appt [facility] 6/27 @ 11:45. Asked pt to call back to confirm appt

## 2021-04-22 NOTE — Addendum Note (Signed)
Addended by: Scarlette Calico on: 04/22/2021 03:49 PM   Modules accepted: Orders

## 2021-04-22 NOTE — Telephone Encounter (Signed)
Orders placed, will arrange

## 2021-04-22 NOTE — Telephone Encounter (Signed)
-----   Message from Claudia Pollock sent at 04/22/2021  1:08 PM EDT ----- I still have not gotten a check out router for this pt and no order for CT chest in Epic. THANKS ----- Message ----- From: Harvie Junior, CMA Sent: 04/13/2021  10:56 AM EDT To: Larey Dresser, MD, Claudia Pollock, #  Authorization #[id number] valid 04/19/21-05/19/21 ----- Message ----- From: Larey Dresser, MD Sent: 04/12/2021   1:22 PM EDT To: Larey Dresser, MD, Scarlette Calico, RN, #  This patient needs high resolution CT chest to look for any evidence of pulmonary sarcoidosis. She also will need ACE level drawn.

## 2021-04-25 ENCOUNTER — Other Ambulatory Visit: Payer: Self-pay

## 2021-04-25 ENCOUNTER — Ambulatory Visit (HOSPITAL_COMMUNITY)
Admission: RE | Admit: 2021-04-25 | Discharge: 2021-04-25 | Disposition: A | Discharge status: Home / Self Care | Payer: Medicare PPO | Source: Ambulatory Visit | Attending: Internal Medicine | Admitting: Internal Medicine

## 2021-04-25 DIAGNOSIS — I2510 Atherosclerotic heart disease of native coronary artery without angina pectoris: Secondary | ICD-10-CM | POA: Insufficient documentation

## 2021-04-25 DIAGNOSIS — D869 Sarcoidosis, unspecified: Secondary | ICD-10-CM | POA: Diagnosis not present

## 2021-04-25 DIAGNOSIS — K449 Diaphragmatic hernia without obstruction or gangrene: Secondary | ICD-10-CM | POA: Diagnosis not present

## 2021-04-25 DIAGNOSIS — I700 Atherosclerosis of aorta: Secondary | ICD-10-CM | POA: Diagnosis not present

## 2021-04-25 LAB — BASIC METABOLIC PANEL
Anion gap: 9 (ref 5–15)
BUN: 22 mg/dL (ref 8–23)
CO2: 26 mmol/L (ref 22–32)
Calcium: 9.4 mg/dL (ref 8.9–10.3)
Chloride: 106 mmol/L (ref 98–111)
Creatinine, Ser: 0.88 mg/dL (ref 0.44–1.00)
GFR, Estimated: >60 mL/min (ref >60)
Glucose, Bld: 104 mg/dL — ABNORMAL HIGH (ref 70–99)
Potassium: 4.3 mmol/L (ref 3.5–5.1)
Sodium: 141 mmol/L (ref 135–145)

## 2021-04-26 ENCOUNTER — Ambulatory Visit (HOSPITAL_COMMUNITY)
Admission: RE | Admit: 2021-04-26 | Discharge: 2021-04-26 | Disposition: A | Discharge status: Home / Self Care | Payer: Medicare PPO | Source: Ambulatory Visit | Attending: Cardiology | Admitting: Cardiology

## 2021-04-26 DIAGNOSIS — D869 Sarcoidosis, unspecified: Secondary | ICD-10-CM | POA: Diagnosis not present

## 2021-05-10 ENCOUNTER — Encounter (HOSPITAL_COMMUNITY): Payer: Self-pay | Admitting: Cardiology

## 2021-05-10 ENCOUNTER — Other Ambulatory Visit: Payer: Self-pay

## 2021-05-10 ENCOUNTER — Ambulatory Visit (HOSPITAL_COMMUNITY)
Admission: RE | Admit: 2021-05-10 | Discharge: 2021-05-10 | Disposition: A | Discharge status: Home / Self Care | Payer: Medicare PPO | Source: Ambulatory Visit | Attending: Cardiology | Admitting: Cardiology

## 2021-05-10 VITALS — BP 130/80 | HR 61 | Wt 202.8 lb

## 2021-05-10 DIAGNOSIS — R002 Palpitations: Secondary | ICD-10-CM | POA: Insufficient documentation

## 2021-05-10 DIAGNOSIS — Z79899 Other long term (current) drug therapy: Secondary | ICD-10-CM | POA: Insufficient documentation

## 2021-05-10 DIAGNOSIS — I472 Ventricular tachycardia: Secondary | ICD-10-CM | POA: Insufficient documentation

## 2021-05-10 DIAGNOSIS — D869 Sarcoidosis, unspecified: Secondary | ICD-10-CM | POA: Insufficient documentation

## 2021-05-10 DIAGNOSIS — G4733 Obstructive sleep apnea (adult) (pediatric): Secondary | ICD-10-CM | POA: Insufficient documentation

## 2021-05-10 DIAGNOSIS — R0789 Other chest pain: Secondary | ICD-10-CM | POA: Diagnosis not present

## 2021-05-10 DIAGNOSIS — I471 Supraventricular tachycardia: Secondary | ICD-10-CM | POA: Diagnosis not present

## 2021-05-10 LAB — SEDIMENTATION RATE: Sed Rate: 28 mm/hr — ABNORMAL HIGH (ref 0–22)

## 2021-05-10 LAB — C-REACTIVE PROTEIN: CRP: 0.8 mg/dL (ref <1.0)

## 2021-05-10 NOTE — Patient Instructions (Signed)
Routine lab work today. Will notify you of abnormal results  Follow up in 3 months  You have been referred to Dr.Ravi Kava at [place] [facility].

## 2021-05-10 NOTE — Progress Notes (Signed)
PCP: Reynold Bowen, MD Cardiology: Dr. Radford Pax HF Cardiology: Dr. Aundra Dubin  65 y.o. with OSA and arrhythmias (atrial tachycardia, PVCs/NSVT) was referred to CHF clinic by Dr. Radford Pax for evaluation of abnormal cardiac MRI.  For about 6 years, patient has had episodes where she will wake up at night feeling palpitations and chest pressure. This has gradually worsened over time.  It now happens several times a month. She has been diagnosed with OSA and uses CPAP, but this has not stopped the symptoms. She does not have exertional dyspnea or chest pain.  She does not tend to feel palpitations during the day.  Echo in 6/21 showed EF 60-65%.  She has been noted to have both atrial and ventricular arrhythmias.  Last Zio patch in 12/21 showed NSVT up to 4 beats, < 1% PVCs, atrial tachycardia up to 17 beats, frequent PACs.  Coronary CTA showed no significant coronary disease.  With arrhythmias, cardiac MRI was done.  This showed LV EF 61%, RV EF 58%, basal inferolateral LGE with associated elevated T2 signal => concern for myocarditis versus sarcoidosis.  She has no family history of sarcoidosis.  She had a severe flu-like illness for several weeks in 3/18.   She had a cardiac PET in 6/22.  This showed 3 lateral wall segments with mildly abnormal metabolism suggesting mildly active inflammation.  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  High resolution CT chest was done in 6/22 to see if there was evidence for pulmonary sarcoidosis or mediastinal lymphadenopathy, but this study showed no adenopathy or lung parenchymal disease.   Patient returns for followup of palpitations and abnormal cMRI/cPET.  She has been doing well symptomatically.  No exertional dyspnea or chest pain.  She has occasional palpitations, mainly at night.  She did not increase Toprol XL as the palpitations have not been bothering her much.  No orthopnea/PND.  No syncope.  Using CPAP at night.    ECG (personally reviewed): NSR, poor RWP  PMH: 1. Hypothyroidism 2. Hyperlipidemia 3. GERD 4. OSA: Uses CPAP 5. Migraines 6. Arrhythmias: History of atrial tachycardia and PVCs. - Zio patch (12/21): NSVT up to 4 beats, < 1% PVCs, atrial tachycardia up to 17 beats, frequent PACs.  - Echo (6/21): EF 60-65%, normal RV.  - cardiac MRI (10/21): LV EF 61%, RV EF 58%, basal inferolateral LGE with associated elevated T2 signal => concern for myocarditis versus sarcoidosis.  - Coronary CTA (2/22): CAC 2 Agatston units, minimal atherosclerosis, no lymphadenopathy noted and normal lung fields.  - Cardiac PET (6/22): This showed 3 lateral wall segments with mildly abnormal metabolism suggesting mildly active inflammation.  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  - High resolution CT chest (6/22): this study showed no adenopathy or lung parenchymal disease.  FH: No cardiomyopathy, no sarcoidosis.  Father with CABG in his 6s.   Social History   Socioeconomic History   Marital status: Married    Spouse name: Not on file   Number of children: 2   Years of education: Not on file   Highest education level: Bachelor's degree (e.g., BA, AB, BS)  Occupational History   Occupation: [employer] part time    Comment: Hydrologist  Tobacco Use   Smoking status: Never   Smokeless tobacco: Never  Vaping Use   Vaping Use: Never used  Substance and Sexual Activity   Alcohol use: Never   Drug use: Never   Sexual activity:  Not on file  Other Topics Concern   Not on file  Social History Narrative   Lives at home with husband   Right handed   Caffeine: drinks tea at all meals   Social Determinants of Health   Financial Resource Strain: Not on file  Food Insecurity: Not on file  Transportation Needs: Not on file  Physical Activity: Not on file  Stress: Not on file  Social Connections: Not on file  Intimate Partner Violence: Not on  file    ROS: All systems reviewed and negative except as per HPI.   Current Outpatient Medications  Medication Sig Dispense Refill   amLODipine (NORVASC) 5 MG tablet Take 1 tablet (5 mg total) by mouth daily. 90 tablet 3   magnesium oxide (MAGOX 400) 400 (241.3 Mg) MG tablet Take 1 tablet (400 mg total) by mouth daily. 90 tablet 3   metoprolol succinate (TOPROL-XL) 25 MG 24 hr tablet Take 25 mg by mouth daily.     olmesartan (BENICAR) 40 MG tablet Take 40 mg by mouth daily.      RESTASIS 0.05 % ophthalmic emulsion Place 1 drop into both eyes daily.      rosuvastatin (CRESTOR) 5 MG tablet Take 1 tablet (5 mg total) by mouth daily. 90 tablet 3   SYNTHROID 88 MCG tablet TK 1 T PO QD     Vitamin D, Cholecalciferol, 25 MCG (1000 UT) TABS Take 1,000 Units by mouth daily.     No current facility-administered medications for this encounter.   BP 130/80   Pulse 61   Wt 92 kg (202 lb 12.8 oz)   SpO2 99%   BMI 35.92 kg/m  General: NAD Neck: No JVD, no thyromegaly or thyroid nodule.  Lungs: Clear to auscultation bilaterally with normal respiratory effort. CV: Nondisplaced PMI.  Heart regular S1/S2, no S3/S4, no murmur.  No peripheral edema.  No carotid bruit.  Normal pedal pulses.  Abdomen: Soft, nontender, no hepatosplenomegaly, no distention.  Skin: Intact without lesions or rashes.  Neurologic: Alert and oriented x 3.  Psych: Normal affect. Extremities: No clubbing or cyanosis.  HEENT: Normal.   Assessment/Plan: 1. Arrhythmia: Patient has had occasional short NSVT runs, also runs of atrial tachycardia and frequent PACs.  She wakes up at night with palpitations and chest pressure, though symptoms were not clearly correlated to arrhythmias while she was wearing the Zio patch.  Cardiac MRI showed LV EF 61%, RV EF 58%, basal inferolateral LGE with associated elevated T2 signal => concern for myocarditis versus sarcoidosis. Coronary CTA showed no significant coronary disease.  Cardiac PET showed  3 lateral wall segments with mildly abnormal metabolism roughly in the same area as her cMRI abnormality suggesting mildly active inflammation.  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  High resolution CT chest was done in 6/22 to see if there was evidence for pulmonary sarcoidosis or mediastinal lymphadenopathy, but this study showed no adenopathy or lung parenchymal disease. ECG is not significantly abnormal.  Palpitations recently have been minimal and manageable on low dose Toprol XL.  At this point, I am not totally clear what her lateral wall abnormalities by cMRI and cPET represent.  It is possible that she has cardiac sarcoidosis, but I cannot find any evidence for extracardiac sarcoid (no mediastinal adenopathy, lung disease, or rash) to biopsy.  Biopsy of the lateral LV wall would be quite difficult.  Her imaging abnormalities could alternatively be due to myocarditis.  - Check  ACE level, ESR, CRP.   - I do not think we have enough evidence for treatment of cardiac sarcoidosis and I do not see an easy way to biopsy her to try to diagnose sarcoidosis.  My current plan will be to follow her for now given minimal symptoms and only mild inflammation on cPET.  I will arrange for repeat cPET in 4-6 months to look for progression.  I will also refer her to the cardiac sarcoidosis program with Dr. Weyman Croon at [facility] to see if he has any different recommendations for management at this point.  - Continue Toprol XL 25 mg daily, can increase if palpitations worsen.  2. OSA: Continue CPAP.   Followup in 3 months.   Loralie Champagne 05/10/2021

## 2021-05-11 LAB — ANGIOTENSIN CONVERTING ENZYME: Angiotensin-Converting Enzyme: 66 U/L (ref 14–82)

## 2021-05-16 ENCOUNTER — Other Ambulatory Visit (HOSPITAL_COMMUNITY): Payer: Self-pay | Admitting: *Deleted

## 2021-05-16 NOTE — Progress Notes (Signed)
Opened in error

## 2021-05-24 ENCOUNTER — Encounter: Payer: Medicare PPO | Admitting: Internal Medicine

## 2021-06-28 ENCOUNTER — Telehealth (INDEPENDENT_AMBULATORY_CARE_PROVIDER_SITE_OTHER): Payer: Medicare PPO | Admitting: Cardiology

## 2021-06-28 ENCOUNTER — Encounter: Payer: Self-pay | Admitting: Cardiology

## 2021-06-28 ENCOUNTER — Other Ambulatory Visit: Payer: Self-pay

## 2021-06-28 VITALS — BP 109/76 | HR 61 | Ht 63.0 in | Wt 200.0 lb

## 2021-06-28 DIAGNOSIS — I10 Essential (primary) hypertension: Secondary | ICD-10-CM

## 2021-06-28 DIAGNOSIS — R072 Precordial pain: Secondary | ICD-10-CM | POA: Diagnosis not present

## 2021-06-28 DIAGNOSIS — I472 Ventricular tachycardia: Secondary | ICD-10-CM

## 2021-06-28 DIAGNOSIS — E785 Hyperlipidemia, unspecified: Secondary | ICD-10-CM

## 2021-06-28 DIAGNOSIS — I4719 Other supraventricular tachycardia: Secondary | ICD-10-CM

## 2021-06-28 DIAGNOSIS — R000 Tachycardia, unspecified: Secondary | ICD-10-CM

## 2021-06-28 DIAGNOSIS — I471 Supraventricular tachycardia: Secondary | ICD-10-CM | POA: Diagnosis not present

## 2021-06-28 MED ORDER — AMLODIPINE BESYLATE 5 MG PO TABS: 5.0000 mg | ORAL_TABLET | Freq: Every day | ORAL | 3 refills | Status: AC

## 2021-06-28 NOTE — Addendum Note (Signed)
Addended by: Antonieta Iba on: 06/28/2021 09:15 AM   Modules accepted: Orders

## 2021-06-28 NOTE — Patient Instructions (Signed)
Medication Instructions:  Your physician recommends that you continue on your current medications as directed. Please refer to the Current Medication list given to you today.  *If you need a refill on your cardiac medications before your next appointment, please call your pharmacy*   Lab Work: Fasting lipids and ALT If you have labs (blood work) drawn today and your tests are completely normal, you will receive your results only by: [place] (if you have MyChart) OR A paper copy in the mail If you have any lab test that is abnormal or we need to change your treatment, we will call you to review the results.   Testing/Procedures: Overnight pulse oximetry on CPAP   Follow-Up: At [facility], you and your health needs are our priority.  As part of our continuing mission to provide you with exceptional heart care, we have created designated Provider Care Teams.  These Care Teams include your primary Cardiologist (physician) and Advanced Practice Providers (APPs -  Physician Assistants and Nurse Practitioners) who all work together to provide you with the care you need, when you need it.  Your next appointment:   1 year(s)  The format for your next appointment:   In Person  Provider:   Fransico Him, MD

## 2021-06-28 NOTE — Progress Notes (Addendum)
Virtual Visit via Video Note   This visit type was conducted due to national recommendations for restrictions regarding the COVID-19 Pandemic (e.g. social distancing) in an effort to limit this patient's exposure and mitigate transmission in our community.  Due to her co-morbid illnesses, this patient is at least at moderate risk for complications without adequate follow up.  This format is felt to be most appropriate for this patient at this time.  All issues noted in this document were discussed and addressed.  A limited physical exam was performed with this format.  Please refer to the patient's chart for her consent to telehealth for [facility].   ID:  Taylor Alvarado, DOB 1956-05-03, MRN [id number] The patient was identified using 2 identifiers.  Patient Location: Home Provider Location: Office/Clinic  Date:  06/28/2021   ID:  Taylor Alvarado, DOB 04/14/1956, MRN [id number]  PCP:  Reynold Bowen, MD  Cardiologist:  Fransico Him, MD   Chief Complaint  Patient presents with   Follow-up    PVCs, NSVT, atrial tach, OSA,, abnormal cMRI     History of Present Illness:  Taylor Alvarado is a 65yo female with a hx of GERD, HLD, hypothyroidism, OSA on PAP (followed by Neuro) and a 5 year hx of palpitations that awaken her from sleep at night and would only occur when she was in bed at night. She was also having CP with them only at night. She underwent Sleep study showing mild OSA and is on CPAP.  Since then her HAs have improved. Her palpitations seem to be worsened with caffeine intake. She wore a heart monitor showing nonsustained atrial tachycardia/PVCs and WCT and her amlodipine was changed to Cardizem CD 1102m daily.   She had a cath 16 years ago that was normal.   2D echo showed trivial MR and normal LVF.  ETT showed no ischemia.  She has no hx of tobacco use but dose have a fm hx of CAD and her Dad died of CAD. Cardiac MRI was done for workup of ventricular ectopy and showed basal  inferolateral midwall LGE c/w scar seen in nonischemic DCM such as myocarditis or sarcoidosis and normal LVF with EF 61% and normal RVF with EF 58%.  She was referred to AHF clinic for further evaluation of abnormal cMRI and [facility].  She was complaining of continued palpitations at her office visit with Dr. MAundra Dubinand her toprol was increased to 533mdaily.  Cardiac PET showed 3 lateral wall segments with mildly abnormal metabolism roughly in the same area as her cMRI abnormality suggesting mildly active inflammation.  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 High resolution CT chest was done in 6/22 to see if there was evidence for pulmonary sarcoidosis or mediastinal lymphadenopathy, but this study showed no adenopathy or lung parenchymal disease. 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  Biopsy of the lateral LV wall would be quite difficult.  Her imaging abnormalities could alternatively be due to myocarditis..  ACE level  and CRP were normal and mildly elevated ESR.  The plan at last OV with Dr. McAundra Dubinas for repeat cPET in 4-6 months to look for progression.  She was also referred to the cardiac sarcoidosis program [facility]  see if he has any different recommendations for management at this point.   She is here today for followup and is doing well.  She denies any chest pain or pressure, except when she gets the palpitations.  She denies any SOB, DOE, PND, orthopnea or syncope. She still has palpitations but only at night and thinks that they may be associated with her OSA as she is a mouth breather. She uses a FFM and tried a chin strap but did not work.  She tells me that her device says that she never has more than 3 events a night.  She is  compliant with her meds and is tolerating meds with no SE.     Past Medical History:  Diagnosis Date   Arthritis    GERD (gastroesophageal reflux disease)    High blood pressure 2020   Hyperlipidemia    Hypothyroidism    Migraine    diagnosed at 50, [facility].   Morning headache 02/20/2019   On home oxygen therapy 03/01/2020   OSA on CPAP 03/01/2020   Osteoarthritis    PAT (paroxysmal atrial tachycardia) (HCC)    Premature atrial contractions    PVC's (premature ventricular contractions)    Sleep-related hypoxia 03/01/2020   Snoring 02/20/2019   Unexplained night sweats 02/20/2019   Wide-complex tachycardia ([place])     Past Surgical History:  Procedure Laterality Date   CESAREAN SECTION  1983, [place]    Current Medications: Current Meds  Medication Sig   amLODipine (NORVASC) 5 MG tablet Take 1 tablet (5 mg total) by mouth daily.   Black Pepper-Turmeric 02-999 MG CAPS    magnesium oxide (MAGOX 400) 400 (241.3 Mg) MG tablet Take 1 tablet (400 mg total) by mouth daily. (Patient taking differently: Take 500 mg by mouth daily.)   metoprolol succinate (TOPROL-XL) 25 MG 24 hr tablet Take 25 mg by mouth daily.   olmesartan (BENICAR) 40 MG tablet Take 40 mg by mouth daily.    RESTASIS 0.05 % ophthalmic emulsion Place 1 drop into both eyes daily.    rosuvastatin (CRESTOR) 5 MG tablet Take 1 tablet (5 mg total) by mouth daily.   SYNTHROID 88 MCG tablet TK 1 T PO QD   Vitamin D, Cholecalciferol, 25 MCG (1000 UT) TABS Take 1,000 Units by mouth daily.    Allergies:   Penicillins, Septra [sulfamethoxazole-trimethoprim], and Shellfish allergy   Social History   Socioeconomic History   Marital status: Married    Spouse name: Not on file   Number of children: 2   Years of education: Not on file   Highest education level: Bachelor's degree (e.g., BA, AB, BS)  Occupational History   Occupation: [employer] part time    Comment:  Hydrologist  Tobacco Use   Smoking status: Never   Smokeless tobacco: Never  Vaping Use   Vaping Use: Never used  Substance and Sexual Activity   Alcohol use: Never   Drug use: Never   Sexual activity: Not on file  Other Topics Concern   Not on file  Social History Narrative   Lives at home with husband   Right handed   Caffeine: drinks tea at all meals   Social Determinants of Health   Financial Resource Strain: Not on file  Food Insecurity: Not on file  Transportation Needs: Not on file  Physical Activity: Not on file  Stress: Not on file  Social  Connections: Not on file     Family History:  The patient's family history includes Bone cancer in her paternal grandmother; Breast cancer in her maternal grandmother; Diabetes type I in her brother; Diabetes type II in her father; Headache in her mother and sister; Heart disease in her brother and father.   ROS:   Please see the history of present illness.    ROS All other systems reviewed and are negative.  No flowsheet data found.   PHYSICAL EXAM:   VS:  BP 109/76   Pulse 61   Ht 5' 3" (1.6 m)   Wt 200 lb (90.7 kg)   BMI 35.43 kg/m     Wt Readings from Last 3 Encounters:  06/28/21 200 lb (90.7 kg)  05/10/21 202 lb 12.8 oz (92 kg)  02/14/21 209 lb (94.8 kg)    GEN: Well nourished, well developed in no acute distress HEENT: Normal NECK: No JVD; No carotid bruits LYMPHATICS: No lymphadenopathy CARDIAC:RRR, no murmurs, rubs, gallops RESPIRATORY:  Clear to auscultation without rales, wheezing or rhonchi  ABDOMEN: Soft, non-tender, non-distended MUSCULOSKELETAL:  No edema; No deformity  SKIN: Warm and dry NEUROLOGIC:  Alert and oriented x 3 PSYCHIATRIC:  Normal affect    Studies/Labs Reviewed:    Recent Labs: 12/01/2020: Magnesium 1.8; TSH 1.900 03/29/2021: ALT 14 04/25/2021: BUN 22; Creatinine, Ser 0.88; Potassium 4.3; Sodium 141   Lipid Panel    Component Value Date/Time   CHOL 181 03/29/2021  0844   TRIG 53 03/29/2021 0844   HDL 68 03/29/2021 0844   CHOLHDL 2.7 03/29/2021 0844   LDLCALC 103 (H) 03/29/2021 0844    Additional studies/ records that were reviewed today include:  2D echo 03/2020 IMPRESSIONS   1. Left ventricular ejection fraction, by estimation, is 60 to 65%. The  left ventricle has normal function. The left ventricle has no regional  wall motion abnormalities. Left ventricular diastolic parameters are  consistent with Grade I diastolic  dysfunction (impaired relaxation). The average left ventricular global  longitudinal strain is -23.7 %. The global longitudinal strain is normal.   2. Right ventricular systolic function is normal. The right ventricular  size is normal. There is normal pulmonary artery systolic pressure.   3. The mitral valve is normal in structure. Trivial mitral valve  regurgitation. No evidence of mitral stenosis.   4. The aortic valve is tricuspid. Aortic valve regurgitation is not  visualized. No aortic stenosis is present.   5. The inferior vena cava is normal in size with greater than 50%  respiratory variability, suggesting right atrial pressure of 3 mmHg.     ASSESSMENT:    1. PAT (paroxysmal atrial tachycardia) ([place])   2. Precordial pain   3. Wide-complex tachycardia ([place])   4. Primary hypertension   5. Hyperlipidemia, unspecified hyperlipidemia type      PLAN:  In order of problems listed above:  1.  Palpitations/nonsustained atrial tachycardia -event monitor showed PVCs, nonsustained atrial tachycardia and wide complex tachycardia up to 5 beats -her amlodipine was stopped she was started on Cardizem CD 1103m but then asked to changed back to amlodipine and BB was added -2D echo and ETT was normal -Cardiac MRI showed basal inferolateral midwall LGE, which is a scar pattern seen in nonischemic cardiomyopathies such as myocarditis or sarcoidosis. 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 Elevated T2 values in basal  inferior/inferolateral wall suggested  myocardial edema -palpitations still present but only at night but controlled on Toprol -Continue prescription drug management with Toprol  XL 32m daily with PRN refills  2.  Chest pain/Coronary artery calcifications -this is atypical in that it only occurs at night and only occurs when she gets palpitations  -she does have a fm hx of CAD but later age in life -EKG is normal -ETT showed no ischemia -coronary CTA with no significant disease (0-24% dLM) -she has not really had any CP recently  3. NSVT/PVCs/Abnormal cMRI -2D echo with normal LVF -Cardiac MRI showed LV EF 61%, RV EF 58%, basal inferolateral LGE with associated elevated T2 signal => concern for myocarditis versus sarcoidosis. -Coronary CTA showed no significant coronary disease.  -Cardiac PET showed 3 lateral wall segments with mildly abnormal metabolism roughly in the same area as her cMRI abnormality suggesting mildly active inflammation.  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 -High resolution CT chest was done in 6/22 to see if there was evidence for pulmonary sarcoidosis or mediastinal lymphadenopathy, but this study showed no adenopathy or lung parenchymal disease. 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  Biopsy of the lateral LV wall would be quite difficult.  Her imaging abnormalities could alternatively be due to myocarditis.  -ACE level  and CRP were normal and mildly elevated ESR -plan at last OV with Dr. MAundra Dubinwas for repeat cPET in 4-6 months to look for progression.   -she was also referred to the cardiac sarc[facility] if [facility] has any different recommendations for management at this point.  -she thinks that her palpitations at night are from her  OSA -I will get an overnight pulse ox on her CPAP to make sure she is not desaturating.  Her AHI is never above 3/hr on her device at home -Continue prescription drug management with Toprol XL 211mdaily with PRN refills  4.  HTN -BP is well controlled -Continue prescription drug management with Amlodipine 36m70maily, Benicar 32m36mily and Toprol XL 236mg56mly > refilled  5.  HLD -LDL goal at least < 70 -I have personally reviewed and interpreted outside labs performed by patient's PCP which showed LDL 103, HDL 68, ALT 14 in May 2022 (only on Crestor 36mg d56my for 30 days when this was checked) -Continue prescription drug management with Crestor 36mg da46m>refilled -repeat FLP and ALT  Followup with me in 1 year   Medication Adjustments/Labs and Tests Ordered: Current medicines are reviewed at length with the patient today.  Concerns regarding medicines are outlined above.  Medication changes, Labs and Tests ordered today are listed in the Patient Instructions below.  There are no Patient Instructions on file for this visit.   Signed, Deamber Buckhalter TFransico Him/30/2022 9:01 AM    [facility] [address] [id number] [phone]

## 2021-06-30 ENCOUNTER — Other Ambulatory Visit: Payer: Medicare PPO

## 2021-06-30 ENCOUNTER — Other Ambulatory Visit: Payer: Self-pay

## 2021-06-30 DIAGNOSIS — E785 Hyperlipidemia, unspecified: Secondary | ICD-10-CM

## 2021-06-30 LAB — ALT: ALT: 12 IU/L (ref 0–32)

## 2021-06-30 LAB — LIPID PANEL
Chol/HDL Ratio: 2.5 ratio (ref 0.0–4.4)
Cholesterol, Total: 167 mg/dL (ref 100–199)
HDL: 68 mg/dL (ref >39)
LDL Chol Calc (NIH): 88 mg/dL (ref 0–99)
Triglycerides: 55 mg/dL (ref 0–149)
VLDL Cholesterol Cal: 11 mg/dL (ref 5–40)

## 2021-07-05 ENCOUNTER — Telehealth: Payer: Self-pay

## 2021-07-05 DIAGNOSIS — E785 Hyperlipidemia, unspecified: Secondary | ICD-10-CM

## 2021-07-05 MED ORDER — ROSUVASTATIN CALCIUM 10 MG PO TABS: 10.0000 mg | ORAL_TABLET | Freq: Every day | ORAL | 3 refills | Status: DC

## 2021-07-05 NOTE — Telephone Encounter (Signed)
-----   Message from Sueanne Margarita, MD sent at 07/04/2021  9:00 PM EDT ----- LDL not at goal - increase Crestor to '10mg'$  daily and repeat FLp and ALT in 6 weeks

## 2021-07-05 NOTE — Telephone Encounter (Signed)
The patient has been notified of the result and verbalized understanding.  All questions (if any) were answered. Antonieta Iba, RN 07/05/2021 4:48 PM  Patient is hesitant to increase Crestor to 10 mg daily. She is concerned about side effects. I have advised patient on reasoning for increase in medication and that if she cannot tolerate the higher dose then we can look into other options. Patient is agreeable to try the increased dose. She will let us know if she cannot tolerate it.

## 2021-07-07 ENCOUNTER — Other Ambulatory Visit: Payer: Self-pay

## 2021-07-07 ENCOUNTER — Ambulatory Visit (AMBULATORY_SURGERY_CENTER): Payer: Medicare PPO

## 2021-07-07 VITALS — Ht 63.0 in | Wt 198.0 lb

## 2021-07-07 DIAGNOSIS — Z86010 Personal history of colonic polyps: Secondary | ICD-10-CM

## 2021-07-07 MED ORDER — NA SULFATE-K SULFATE-MG SULF 17.5-3.13-1.6 GM/177ML PO SOLN
1.0000 | Freq: Once | ORAL | 0 refills | Status: AC
Start: 2021-07-07 — End: 2021-07-07

## 2021-07-07 NOTE — Progress Notes (Signed)
    Patient's pre-visit was done today over the phone with the patient   Name,DOB and address verified.   Patient denies any allergies to Eggs and Soy.  Patient denies any problems with anesthesia/sedation. Patient denies taking diet pills or blood thinners.  Denies atrial flutter or atrial fib Denies chronic constipation No home Oxygen.   Packet of Prep instructions mailed to patient including a copy of a consent form-pt is aware.  Patient understands to call us back with any questions or concerns.  Patient is aware of our care-partner policy and 0000000 safety protocol.        Denies home oxygen therapy, although it was noted in the chart-pt states is was no longer in use-last used about 1-2 yrs ago.

## 2021-07-26 ENCOUNTER — Other Ambulatory Visit: Payer: Self-pay

## 2021-07-26 ENCOUNTER — Encounter: Payer: Self-pay | Admitting: Internal Medicine

## 2021-07-26 ENCOUNTER — Ambulatory Visit (AMBULATORY_SURGERY_CENTER): Payer: Medicare PPO | Admitting: Internal Medicine

## 2021-07-26 VITALS — BP 114/59 | HR 65 | Temp 97.8°F | Resp 13 | Ht 63.0 in | Wt 198.0 lb

## 2021-07-26 DIAGNOSIS — Z86010 Personal history of colonic polyps: Secondary | ICD-10-CM | POA: Diagnosis not present

## 2021-07-26 DIAGNOSIS — D122 Benign neoplasm of ascending colon: Secondary | ICD-10-CM

## 2021-07-26 MED ORDER — SODIUM CHLORIDE 0.9 % IV SOLN: 500.0000 mL | Freq: Once | INTRAVENOUS | Status: DC

## 2021-07-26 NOTE — Op Note (Signed)
[place] [facility] Patient Name: Taylor Alvarado Procedure Date: 07/26/2021 12:11 PM MRN: [id number] Endoscopist: Docia Chuck. Henrene Pastor , MD Age: 65 Referring MD:  Date of Birth: 07-12-1956 Gender: Female Account #: [id number] Procedure:                Colonoscopy with cold snare polypectomy x 1 Indications:              High risk colon cancer surveillance: Personal                            history of sessile serrated colon polyp (less than                            10 mm in size) with no dysplasia. Index examination                            2005 was negative for neoplasia. Subsequent                            examination 2015 Medicines:                Monitored Anesthesia Care Procedure:                Pre-Anesthesia Assessment:                           - Prior to the procedure, a History and Physical                            was performed, and patient medications and                            allergies were reviewed. The patient's tolerance of                            previous anesthesia was also reviewed. The risks                            and benefits of the procedure and the sedation                            options and risks were discussed with the patient.                            All questions were answered, and informed consent                            was obtained. Prior Anticoagulants: The patient has                            taken no previous anticoagulant or antiplatelet                            agents. ASA Grade Assessment: II - A patient with  mild systemic disease. After reviewing the risks                            and benefits, the patient was deemed in                            satisfactory condition to undergo the procedure.                           After obtaining informed consent, the colonoscope                            was passed under direct vision. Throughout the                            procedure, the  patient's blood pressure, pulse, and                            oxygen saturations were monitored continuously. The                            Colonoscope was introduced through the anus and                            advanced to the the cecum, identified by                            appendiceal orifice and ileocecal valve. The                            ileocecal valve, appendiceal orifice, and rectum                            were photographed. The quality of the bowel                            preparation was excellent. The colonoscopy was                            performed without difficulty. The patient tolerated                            the procedure well. The bowel preparation used was                            SUPREP via split dose instruction. Scope In: 12:28:44 PM Scope Out: 12:39:28 PM Scope Withdrawal Time: 0 hours 8 minutes 8 seconds  Total Procedure Duration: 0 hours 10 minutes 44 seconds  Findings:                 A 1 mm polyp was found in the ascending colon. The                            polyp was removed with a cold snare. Resection  and                            retrieval were complete.                           Multiple diverticula were found in the left colon.                           The exam was otherwise without abnormality on                            direct and retroflexion views. Complications:            No immediate complications. Estimated blood loss:                            None. Estimated Blood Loss:     Estimated blood loss: none. Impression:               - One 1 mm polyp in the ascending colon, removed                            with a cold snare. Resected and retrieved.                           - Diverticulosis in the left colon.                           - The examination was otherwise normal on direct                            and retroflexion views. Recommendation:           - Repeat colonoscopy in 7 years for surveillance.                            - Patient has a contact number available for                            emergencies. The signs and symptoms of potential                            delayed complications were discussed with the                            patient. Return to normal activities tomorrow.                            Written discharge instructions were provided to the                            patient.                           - Resume previous diet.                           -  Continue present medications.                           - Await pathology results. Docia Chuck. Henrene Pastor, MD 07/26/2021 12:44:02 PM This report has been signed electronically.

## 2021-07-26 NOTE — Progress Notes (Signed)
HISTORY OF PRESENT ILLNESS:  Taylor Alvarado is a 65 y.o. female who presents today for surveillance colonoscopy.  Index exam 2005 was negative for neoplasia.  His recent examination 2015 revealed a small sessile serrated polyp.  No active GI complaints.  Tolerated prep well  REVIEW OF SYSTEMS:  All non-GI ROS negative.  Past Medical History:  Diagnosis Date   Arthritis    GERD (gastroesophageal reflux disease)    High blood pressure 2020   Hyperlipidemia    Hypothyroidism    Migraine    diagnosed at 50, [facility].   Morning headache 02/20/2019   On home oxygen therapy 03/01/2020   OSA on CPAP 03/01/2020   Osteoarthritis    PAT (paroxysmal atrial tachycardia) (HCC)    Premature atrial contractions    PVC's (premature ventricular contractions)    Sleep-related hypoxia 03/01/2020   Snoring 02/20/2019   Unexplained night sweats 02/20/2019   Wide-complex tachycardia [facility])     Past Surgical History:  Procedure Laterality Date   CESAREAN SECTION  1983, [place]    Social History Taylor Alvarado  reports that she has never smoked. She has never used smokeless tobacco. She reports that she does not drink alcohol and does not use drugs.  family history includes Bone cancer in her paternal grandmother; Breast cancer in her maternal grandmother; Diabetes type I in her brother; Diabetes type II in her father; Headache in her mother and sister; Heart disease in her brother and father.  Allergies  Allergen Reactions   Penicillins Itching    Throat swells   Septra [Sulfamethoxazole-Trimethoprim] Hives   Shellfish Allergy Itching    Tightness in throat       PHYSICAL EXAMINATION:  Vital signs: BP (!) 126/57 (Patient Position: Sitting)   Pulse (!) 59   Temp 97.8 F (36.6 C)   Ht 5\' 3"  (1.6 m)   Wt 198 lb (89.8 kg)   SpO2 97%   BMI 35.07 kg/m  General: Well-developed, well-nourished, no acute distress HEENT: Sclerae  are anicteric, conjunctiva pink. Oral mucosa intact Lungs: Clear Heart: Regular Abdomen: soft, nontender, nondistended, no obvious ascites, no peritoneal signs, normal bowel sounds. No organomegaly. Extremities: No edema Psychiatric: alert and oriented x3. Cooperative     ASSESSMENT:  1.  History of sessile serrated polyp 2015.  Due for surveillance   PLAN:   1.  Surveillance colonoscopy

## 2021-07-26 NOTE — Progress Notes (Signed)
Pt's states no medical or surgical changes since previsit or office visit.  ° °CHECK-IN-AER ° °V/S-DT °

## 2021-07-26 NOTE — Patient Instructions (Signed)
Please read handouts provided. Continue present medications. Await pathology results. Repeat colonoscopy in 7 years for screening.  YOU HAD AN ENDOSCOPIC PROCEDURE TODAY AT [facility]:   Refer to the procedure report that was given to you for any specific questions about what was found during the examination.  If the procedure report does not answer your questions, please call your gastroenterologist to clarify.  If you requested that your care partner not be given the details of your procedure findings, then the procedure report has been included in a sealed envelope for you to review at your convenience later.  YOU SHOULD EXPECT: Some feelings of bloating in the abdomen. Passage of more gas than usual.  Walking can help get rid of the air that was put into your GI tract during the procedure and reduce the bloating. If you had a lower endoscopy (such as a colonoscopy or flexible sigmoidoscopy) you may notice spotting of blood in your stool or on the toilet paper. If you underwent a bowel prep for your procedure, you may not have a normal bowel movement for a few days.  Please Note:  You might notice some irritation and congestion in your nose or some drainage.  This is from the oxygen used during your procedure.  There is no need for concern and it should clear up in a day or so.  SYMPTOMS TO REPORT IMMEDIATELY:  Following lower endoscopy (colonoscopy or flexible sigmoidoscopy):  Excessive amounts of blood in the stool  Significant tenderness or worsening of abdominal pains  Swelling of the abdomen that is new, acute  Fever of 100F or higher   For urgent or emergent issues, a gastroenterologist can be reached at any hour by calling [phone]. Do not use MyChart messaging for urgent concerns.    DIET:  We do recommend a small meal at first, but then you may proceed to your regular diet.  Drink plenty of fluids but you should avoid alcoholic beverages for 24  hours.  ACTIVITY:  You should plan to take it easy for the rest of today and you should NOT DRIVE or use heavy machinery until tomorrow (because of the sedation medicines used during the test).    FOLLOW UP: Our staff will call the number listed on your records 48-72 hours following your procedure to check on you and address any questions or concerns that you may have regarding the information given to you following your procedure. If we do not reach you, we will leave a message.  We will attempt to reach you two times.  During this call, we will ask if you have developed any symptoms of COVID 19. If you develop any symptoms (ie: fever, flu-like symptoms, shortness of breath, cough etc.) before then, please call [phone].  If you test positive for Covid 19 in the 2 weeks post procedure, please call and report this information to [place].    If any biopsies were taken you will be contacted by phone or by letter within the next 1-3 weeks.  Please call us at [phone] if you have not heard about the biopsies in 3 weeks.    SIGNATURES/CONFIDENTIALITY: You and/or your care partner have signed paperwork which will be entered into your electronic medical record.  These signatures attest to the fact that that the information above on your After Visit Summary has been reviewed and is understood.  Full responsibility of the confidentiality of this discharge information lies with you and/or your care-partner.

## 2021-07-26 NOTE — Progress Notes (Signed)
Called to room to assist during endoscopic procedure.  Patient ID and intended procedure confirmed with present staff. Received instructions for my participation in the procedure from the performing physician.  

## 2021-07-26 NOTE — Progress Notes (Signed)
Sedate, gd SR, tolerated procedure well, VSS, report to RN 

## 2021-07-28 ENCOUNTER — Telehealth: Payer: Self-pay | Admitting: *Deleted

## 2021-07-28 ENCOUNTER — Encounter: Payer: Self-pay | Admitting: Internal Medicine

## 2021-07-28 NOTE — Telephone Encounter (Signed)
  Follow up Call-  Call back number 07/26/2021  Post procedure Call Back phone  # [phone]  Permission to leave phone message Yes  Some recent data might be hidden     Patient questions:  Message left to call if necessary.

## 2021-07-28 NOTE — Telephone Encounter (Signed)
Follow up call made. 

## 2021-08-04 ENCOUNTER — Encounter (HOSPITAL_COMMUNITY): Payer: Self-pay

## 2021-08-09 ENCOUNTER — Encounter (HOSPITAL_COMMUNITY): Payer: Medicare PPO | Admitting: Cardiology

## 2021-09-13 NOTE — Progress Notes (Signed)
PATIENT: Taylor Alvarado DOB: October 31, 1955  REASON FOR VISIT: follow up HISTORY FROM: patient  HISTORY OF PRESENT ILLNESS: Today 09/14/21: Taylor Alvarado is a 65 year old female with a history of obstructive sleep apnea on CPAP.  She returns today for follow-up.  She states that the CPAP continues to work well for her.  She still will wake up in the middle the night with palpitations.  These events are totally random.  She states that she will go weeks with none and then she may have a night or two with them.  She has had a complete cardiac work-up that has so far been relatively unremarkable.  She returns today for an evaluation.    02/12/20: Taylor Alvarado is a 65 year old female with a history of obstructive sleep apnea on CPAP.  She returns today for follow-up.  Her download indicates that she used her machine nightly for compliance of 100%.  She used her machine greater than 4 hours each night.  On average she uses her machine 7 hours and 52 minutes.  Her residual AHI is 0.9 on 5 to 15 cm of water with EPR 2.  Leak in the 95th percentile is 20.8 L/min.  She has nocturnal O2 however due to her insurance change and she now needs a CPAP titration in order to have this approved through her insurance.  Patient reports that her headaches have improved.  No longer having cluster headaches.  She reports that she continues to have palpitations during the night.  She has not had a formal cardiac work-up.  HISTORY 06/26/19:   Taylor Alvarado is a 65 year old female with a history of obstructive sleep apnea on CPAP.  She returns today for follow-up.  Her download indicates that she used her machine 29 out of 30 days for compliance of 97%.  She use her machine greater than 4 hours each night.  On average she uses her machine 7 hours and 32 minutes.  Her residual AHI is 0.9 on 5 to 15 cm of water with EPR of 2.  Her leak in the 95th percentile is 15.1.  She states that since using the machine the cardiac episodes  that she was having at night have decreased but not completely resolved.  She states that there is been several occasions that she wakes up with mild chest pain and feels as if her heart is racing.  But does notice improved slightly since starting CPAP.  The patient did have overnight pulse oximetry.  Her O2 remained less than 89% for 12 minutes and 37 seconds.  She does qualify for supplemental oxygen.  This has not been started yet as we just got the results from her DME company today.  She returns today for evaluation.  REVIEW OF SYSTEMS: Out of a complete 14 system review of symptoms, the patient complains only of the following symptoms, and all other reviewed systems are negative.   ESS 6  ALLERGIES: Allergies  Allergen Reactions   Penicillins Itching    Throat swells   Septra [Sulfamethoxazole-Trimethoprim] Hives   Shellfish Allergy Itching    Tightness in throat    HOME MEDICATIONS: Outpatient Medications Prior to Visit  Medication Sig Dispense Refill   acetaminophen (TYLENOL) 500 MG tablet as needed.     amLODipine (NORVASC) 5 MG tablet Take 1 tablet (5 mg total) by mouth daily. 90 tablet 3   Black Pepper-Turmeric 02-999 MG CAPS      Ibuprofen 200 MG CAPS as needed.  magnesium oxide (MAGOX 400) 400 (241.3 Mg) MG tablet Take 1 tablet (400 mg total) by mouth daily. (Patient taking differently: Take 500 mg by mouth daily.) 90 tablet 3   metoprolol succinate (TOPROL-XL) 25 MG 24 hr tablet Take 25 mg by mouth daily.     olmesartan (BENICAR) 40 MG tablet Take 40 mg by mouth daily.      RESTASIS 0.05 % ophthalmic emulsion Place 1 drop into both eyes daily.      rosuvastatin (CRESTOR) 10 MG tablet Take 1 tablet (10 mg total) by mouth daily. (Patient taking differently: Take 40 mg by mouth once a week.) 90 tablet 3   SYNTHROID 88 MCG tablet TK 1 T PO QD     Vitamin D, Cholecalciferol, 25 MCG (1000 UT) TABS Take 1,000 Units by mouth daily.     No facility-administered medications  prior to visit.    PAST MEDICAL HISTORY: Past Medical History:  Diagnosis Date   Arthritis    GERD (gastroesophageal reflux disease)    High blood pressure 2020   Hyperlipidemia    Hypothyroidism    Migraine    diagnosed at 50, [facility].   Morning headache 02/20/2019   On home oxygen therapy 03/01/2020   OSA on CPAP 03/01/2020   Osteoarthritis    PAT (paroxysmal atrial tachycardia) (HCC)    Premature atrial contractions    PVC's (premature ventricular contractions)    Sleep-related hypoxia 03/01/2020   Snoring 02/20/2019   Unexplained night sweats 02/20/2019   Wide-complex tachycardia     PAST SURGICAL HISTORY: Past Surgical History:  Procedure Laterality Date   CESAREAN SECTION  1983, [place]    FAMILY HISTORY: Family History  Problem Relation Age of Onset   Headache Mother        during menstruation   Heart disease Father    Diabetes type II Father    Headache Sister        during menstruation   Heart disease Brother    Diabetes type I Brother    Breast cancer Maternal Grandmother    Bone cancer Paternal Grandmother    Colon cancer Neg Hx    Colon polyps Neg Hx    Esophageal cancer Neg Hx    Stomach cancer Neg Hx    Rectal cancer Neg Hx    Sleep apnea Neg Hx     SOCIAL HISTORY: Social History   Socioeconomic History   Marital status: Married    Spouse name: Not on file   Number of children: 2   Years of education: Not on file   Highest education level: Bachelor's degree (e.g., BA, AB, BS)  Occupational History   Occupation: [employer] part time    Comment: Hydrologist  Tobacco Use   Smoking status: Never   Smokeless tobacco: Never  Vaping Use   Vaping Use: Never used  Substance and Sexual Activity   Alcohol use: Never   Drug use: Never   Sexual activity: Not on file  Other Topics Concern   Not on file  Social History Narrative   Lives at home with husband   Right handed    Caffeine: drinks tea at all meals   Social Determinants of Health   Financial Resource Strain: Not on file  Food Insecurity: Not on file  Transportation Needs: Not on file  Physical Activity: Not on file  Stress: Not on file  Social Connections: Not  on file  Intimate Partner Violence: Not on file      PHYSICAL EXAM  Vitals:   09/14/21 1053  BP: 138/74  Pulse: 63  Weight: 205 lb 3.2 oz (93.1 kg)  Height: 5\' 3"  (1.6 m)   Body mass index is 36.35 kg/m.  Generalized: Well developed, in no acute distress  Chest: Lungs clear to auscultation bilaterally  Neurological examination  Mentation: Alert oriented to time, place, history taking. Follows all commands speech and language fluent Cranial nerve II-XII: Extraocular movements were full, visual field were full on confrontational test Head turning and shoulder shrug  were normal and symmetric. Motor: The motor testing reveals 5 over 5 strength of all 4 extremities. Good symmetric motor tone is noted throughout.  Sensory: Sensory testing is intact to soft touch on all 4 extremities. No evidence of extinction is noted.  Gait and station: Gait is normal.    DIAGNOSTIC DATA (LABS, IMAGING, TESTING) - I reviewed patient records, labs, notes, testing and imaging myself where available.  No results found for: WBC, HGB, HCT, MCV, PLT    Component Value Date/Time   NA 141 04/25/2021 1137   NA 141 12/01/2020 1150   K 4.3 04/25/2021 1137   CL 106 04/25/2021 1137   CO2 26 04/25/2021 1137   GLUCOSE 104 (H) 04/25/2021 1137   BUN 22 04/25/2021 1137   BUN 21 12/01/2020 1150   CREATININE 0.88 04/25/2021 1137   CALCIUM 9.4 04/25/2021 1137   ALT 12 06/30/2021 0913   GFRNONAA >60 04/25/2021 1137   GFRAA 81 12/01/2020 1150   Lab Results  Component Value Date   CHOL 167 06/30/2021   HDL 68 06/30/2021   LDLCALC 88 06/30/2021   TRIG 55 06/30/2021   CHOLHDL 2.5 06/30/2021   No results found for: HGBA1C No results found for:  VITAMINB12 Lab Results  Component Value Date   TSH 1.900 12/01/2020      ASSESSMENT AND PLAN 65 y.o. year old female  has a past medical history of Arthritis, GERD (gastroesophageal reflux disease), High blood pressure (2020), Hyperlipidemia, Hypothyroidism, Migraine, Morning headache (02/20/2019), On home oxygen therapy (03/01/2020), OSA on CPAP (03/01/2020), Osteoarthritis, PAT (paroxysmal atrial tachycardia) ([place]), Premature atrial contractions, PVC's (premature ventricular contractions), Sleep-related hypoxia (03/01/2020), Snoring (02/20/2019), Unexplained night sweats (02/20/2019), and Wide-complex tachycardia. here with:  OSA on CPAP  - CPAP compliance excellent - Good treatment of AHI  - encourage patient to use CPAP nightly and > 4 hours each night - F/U in 1 year  or sooner if needed  Ward Givens, MSN, NP-C 09/14/2021, 11:11 AM [facility] [address], [place], [place] [address] [phone]

## 2021-09-14 ENCOUNTER — Ambulatory Visit: Payer: Medicare PPO | Admitting: Adult Health

## 2021-09-14 ENCOUNTER — Encounter: Payer: Self-pay | Admitting: Adult Health

## 2021-09-14 VITALS — BP 138/74 | HR 63 | Ht 63.0 in | Wt 205.2 lb

## 2021-09-14 DIAGNOSIS — Z9989 Dependence on other enabling machines and devices: Secondary | ICD-10-CM | POA: Diagnosis not present

## 2021-09-14 DIAGNOSIS — G4733 Obstructive sleep apnea (adult) (pediatric): Secondary | ICD-10-CM

## 2021-09-14 NOTE — Patient Instructions (Signed)
Continue using CPAP nightly and greater than 4 hours each night °If your symptoms worsen or you develop new symptoms please let us know.  ° °

## 2021-10-03 ENCOUNTER — Encounter: Payer: Self-pay | Admitting: Cardiology

## 2021-10-03 MED ORDER — METOPROLOL SUCCINATE ER 25 MG PO TB24: 25.0000 mg | ORAL_TABLET | Freq: Every day | ORAL | 3 refills | Status: AC

## 2021-10-09 ENCOUNTER — Encounter (HOSPITAL_COMMUNITY): Payer: Self-pay | Admitting: Cardiology

## 2021-10-13 ENCOUNTER — Ambulatory Visit (HOSPITAL_COMMUNITY)
Admission: RE | Admit: 2021-10-13 | Discharge: 2021-10-13 | Disposition: A | Discharge status: Home / Self Care | Payer: Medicare PPO | Source: Ambulatory Visit | Attending: Cardiology | Admitting: Cardiology

## 2021-10-13 ENCOUNTER — Other Ambulatory Visit: Payer: Self-pay

## 2021-10-13 ENCOUNTER — Encounter (HOSPITAL_COMMUNITY): Payer: Self-pay | Admitting: Cardiology

## 2021-10-13 VITALS — BP 124/68 | HR 69 | Wt 205.6 lb

## 2021-10-13 DIAGNOSIS — I493 Ventricular premature depolarization: Secondary | ICD-10-CM

## 2021-10-13 DIAGNOSIS — R002 Palpitations: Secondary | ICD-10-CM | POA: Diagnosis present

## 2021-10-13 DIAGNOSIS — I408 Other acute myocarditis: Secondary | ICD-10-CM

## 2021-10-13 DIAGNOSIS — I429 Cardiomyopathy, unspecified: Secondary | ICD-10-CM | POA: Diagnosis not present

## 2021-10-13 DIAGNOSIS — R0789 Other chest pain: Secondary | ICD-10-CM | POA: Diagnosis not present

## 2021-10-13 DIAGNOSIS — G4733 Obstructive sleep apnea (adult) (pediatric): Secondary | ICD-10-CM | POA: Diagnosis not present

## 2021-10-13 DIAGNOSIS — Z7901 Long term (current) use of anticoagulants: Secondary | ICD-10-CM | POA: Diagnosis not present

## 2021-10-13 NOTE — Patient Instructions (Signed)
No medication changes today!  You were ordered for an MRI to be done in November 2023  Your physician recommends that you schedule a follow-up appointment in: as needed.   You need to have an MRI of your heart in November 2023.  Please call in September to ensure its scheduled for November  Please call office at [phone] option 2 if you have any questions or concerns.   At the [facility], you and your health needs are our priority. As part of our continuing mission to provide you with exceptional heart care, we have created designated Provider Care Teams. These Care Teams include your primary Cardiologist (physician) and Advanced Practice Providers (APPs- Physician Assistants and Nurse Practitioners) who all work together to provide you with the care you need, when you need it.   You may see any of the following providers on your designated Care Team at your next follow up: Dr Glori Bickers Dr Haynes Kerns, NP Lyda Jester, [place] [facility] [place], [place] Audry Riles, PharmD   Please be sure to bring in all your medications bottles to every appointment.

## 2021-10-13 NOTE — Progress Notes (Signed)
PCP: Reynold Bowen, MD Cardiology: Dr. Radford Pax HF Cardiology: Dr. Aundra Dubin  65 y.o. with OSA and arrhythmias (atrial tachycardia, PVCs/NSVT) was referred to CHF clinic by Dr. Radford Pax for evaluation of abnormal cardiac MRI.  For about 6 years, patient has had episodes where she will wake up at night feeling palpitations and chest pressure. This has gradually worsened over time.  It now happens several times a month. She has been diagnosed with OSA and uses CPAP, but this has not stopped the symptoms. She does not have exertional dyspnea or chest pain.  She does not tend to feel palpitations during the day.  Echo in 6/21 showed EF 60-65%.  She has been noted to have both atrial and ventricular arrhythmias.  Last Zio patch in 12/21 showed NSVT up to 4 beats, < 1% PVCs, atrial tachycardia up to 17 beats, frequent PACs.  Coronary CTA showed no significant coronary disease.  With arrhythmias, cardiac MRI was done.  This showed LV EF 61%, RV EF 58%, basal inferolateral LGE with associated elevated T2 signal => concern for myocarditis versus sarcoidosis.  She has no family history of sarcoidosis.  She had a severe flu-like illness for several weeks in 3/18.   She had a cardiac PET in 6/22.  This showed 3 lateral wall segments with mildly abnormal metabolism suggesting mildly active inflammation.  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  High resolution CT chest was done in 6/22 to see if there was evidence for pulmonary sarcoidosis or mediastinal lymphadenopathy, but this study showed no adenopathy or lung parenchymal disease.   She was referred to [facility] for evaluation by the cardiac sarcoidosis clinic there.  Cardiac MRI was repeated in 11/22, this showed EF 68% with normal RV, no delayed enhancement. Given lack of delayed enhancement and normal EF, she was not started on immunosuppression.   Patient returns for followup of palpitations and ?cardiac sarcoidosis.   She has been doing well symptomatically.  No exertional dyspnea or chest pain.  She uses CPAP at night.  No orthopnea/PND.  Still feels palpitations occaisonally at night but no change to pattern.   Labs (7/22): ACE level normal, ESR 28 Labs (9/22): LDL 88  PMH: 1. Hypothyroidism 2. Hyperlipidemia 3. GERD 4. OSA: Uses CPAP 5. Migraines 6. Arrhythmias: History of atrial tachycardia and PVCs. - Zio patch (12/21): NSVT up to 4 beats, < 1% PVCs, atrial tachycardia up to 17 beats, frequent PACs.  - Echo (6/21): EF 60-65%, normal RV.  - cardiac MRI (10/21): LV EF 61%, RV EF 58%, basal inferolateral LGE with associated elevated T2 signal => concern for myocarditis versus sarcoidosis.  - Coronary CTA (2/22): CAC 2 Agatston units, minimal atherosclerosis, no lymphadenopathy noted and normal lung fields.  - Cardiac PET (6/22): This showed 3 lateral wall segments with mildly abnormal metabolism suggesting mildly active inflammation.  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  - High resolution CT chest (6/22): this study showed no adenopathy or lung parenchymal disease. - Cardiac MRI (11/22, [facility]): EF 68% with normal RV, no delayed enhancement.  FH: No cardiomyopathy, no sarcoidosis.  Father with CABG in his 49s.   Social History   Socioeconomic History   Marital status: Married    Spouse name: Not on file   Number of children: 2   Years of education: Not on file   Highest education level: Bachelor's degree (e.g., BA, AB, BS)  Occupational History   Occupation: [employer]  part time    Comment: Hydrologist  Tobacco Use   Smoking status: Never   Smokeless tobacco: Never  Vaping Use   Vaping Use: Never used  Substance and Sexual Activity   Alcohol use: Never   Drug use: Never   Sexual activity: Not on file  Other Topics Concern   Not on file  Social History Narrative   Lives at home with husband   Right handed   Caffeine:  drinks tea at all meals   Social Determinants of Health   Financial Resource Strain: Not on file  Food Insecurity: Not on file  Transportation Needs: Not on file  Physical Activity: Not on file  Stress: Not on file  Social Connections: Not on file  Intimate Partner Violence: Not on file    ROS: All systems reviewed and negative except as per HPI.   Current Outpatient Medications  Medication Sig Dispense Refill   acetaminophen (TYLENOL) 500 MG tablet as needed.     amLODipine (NORVASC) 5 MG tablet Take 1 tablet (5 mg total) by mouth daily. 90 tablet 3   Ibuprofen 200 MG CAPS as needed.     Magnesium Oxide 500 MG TABS Take 1 tablet by mouth daily.     metoprolol succinate (TOPROL-XL) 25 MG 24 hr tablet Take 1 tablet (25 mg total) by mouth daily. 90 tablet 3   olmesartan (BENICAR) 40 MG tablet Take 40 mg by mouth daily.      RESTASIS 0.05 % ophthalmic emulsion Place 1 drop into both eyes daily.      rosuvastatin (CRESTOR) 10 MG tablet Take 10 mg by mouth once a week.     SYNTHROID 88 MCG tablet TK 1 T PO QD     Vitamin D, Cholecalciferol, 25 MCG (1000 UT) TABS Take 1,000 Units by mouth daily.     No current facility-administered medications for this encounter.   BP 124/68    Pulse 69    Wt 93.3 kg (205 lb 9.6 oz)    SpO2 96%    BMI 36.42 kg/m  General: NAD Neck: No JVD, no thyromegaly or thyroid nodule.  Lungs: Clear to auscultation bilaterally with normal respiratory effort. CV: Nondisplaced PMI.  Heart regular S1/S2, no S3/S4, no murmur.  No peripheral edema.  No carotid bruit.  Normal pedal pulses.  Abdomen: Soft, nontender, no hepatosplenomegaly, no distention.  Skin: Intact without lesions or rashes.  Neurologic: Alert and oriented x 3.  Psych: Normal affect. Extremities: No clubbing or cyanosis.  HEENT: Normal.   Assessment/Plan: 1. Arrhythmia: Patient has had occasional short NSVT runs, also runs of atrial tachycardia and frequent PACs.  She wakes up at night with  palpitations and chest pressure, though symptoms were not clearly correlated to arrhythmias while she was wearing the Zio patch.  Cardiac MRI showed LV EF 61%, RV EF 58%, basal inferolateral LGE with associated elevated T2 signal => concern for myocarditis versus sarcoidosis. Coronary CTA showed no significant coronary disease.  Cardiac PET showed 3 lateral wall segments with mildly abnormal metabolism roughly in the same area as her cMRI abnormality suggesting mildly active inflammation.  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  High resolution CT chest was done in 6/22 to see if there was evidence for pulmonary sarcoidosis or mediastinal lymphadenopathy, but this study showed no adenopathy or lung parenchymal disease. ECG is not significantly abnormal.  Palpitations recently have been minimal and manageable on low dose Toprol XL. She  was seen in the [facility] cardiac sarcoidosis clinic and cMRI was repeated, this time showing no delayed enhancement.  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  - No indication at this time for immunosuppression.  - Repeat cardiac MRI in 1 year (11/23) to reassess.  If this also shows no enhancement, think we will be safe to monitor symptomatically in the future.  - Continue Toprol XL 25 mg daily, can increase if palpitations worsen.  2. OSA: Continue CPAP.   Cardiac MRI in 11/23.   Taylor Alvarado 10/13/2021

## 2021-10-18 ENCOUNTER — Telehealth (HOSPITAL_COMMUNITY): Payer: Self-pay | Admitting: *Deleted

## 2021-10-18 NOTE — Telephone Encounter (Signed)
[facility]- 10/17/21  sw patient, patient states that exam is not due until 08/2022 and that MD gave her the choice of having at [facility] or [facility], patient states that she wants exam done at [facility] 10/13/21, 10/17/21  left message, Aniceto Boss

## 2021-12-13 IMAGING — MR MR CARD MORPHOLOGY WO/W CM
43 of 46 series · 43 of 46 positions shown · IV contrast (gadavist)
Comparison: none

CLINICAL DATA: 64F with NSVT

EXAM:
CARDIAC MRI
TECHNIQUE: The patient was scanned on a 1.5 Tesla Siemens magnet. A dedicated
cardiac coil was used. Functional imaging was done using Fiesta
sequences. [DATE], and 4 chamber views were done to assess for RWMA's.
Modified Skeph rule using a short axis stack was used to
calculate an ejection fraction on a dedicated work station using
Circle software. The patient received 10 cc of Gadavist. After 10
minutes inversion recovery sequences were used to assess for
infiltration and scar tissue.
CONTRAST:  10 cc  of Gadavist

[Series 4: [id number] · axial · 8.0mm · 1.48mm/px · 1 of 16 slices shown]
[im 1/16]
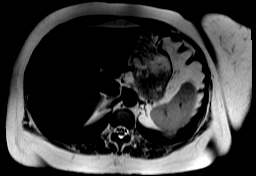

[Series 8: bSSFP · oblique · 8.0mm · 1.61mm/px · 1 of 25 slices shown (1 of 21)]
[im 1/25]
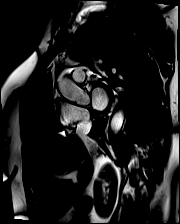

[Series 9: bSSFP · oblique · 8.0mm · 1.61mm/px · 1 of 25 slices shown (2 of 21)]
[im 1/25]
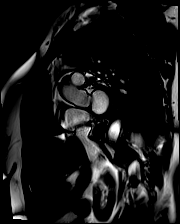

[Series 10: bSSFP · oblique · 8.0mm · 1.61mm/px · 1 of 25 slices shown (3 of 21)]
[im 1/25]
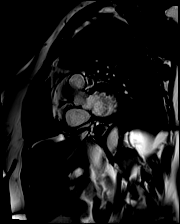

[Series 11: bSSFP · oblique · 8.0mm · 1.61mm/px · 1 of 25 slices shown (4 of 21)]
[im 1/25]
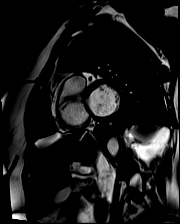

[Series 12: bSSFP · oblique · 8.0mm · 1.61mm/px · 1 of 25 slices shown (5 of 21)]
[im 1/25]
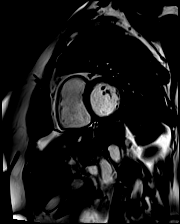

[Series 13: bSSFP · oblique · 8.0mm · 1.61mm/px · 1 of 25 slices shown (6 of 21)]
[im 1/25]
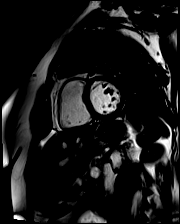

[Series 14: bSSFP · oblique · 8.0mm · 1.61mm/px · 1 of 25 slices shown (7 of 21)]
[im 1/25]
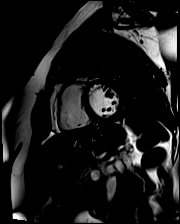

[Series 15: bSSFP · oblique · 8.0mm · 1.61mm/px · 1 of 25 slices shown (8 of 21)]
[im 1/25]
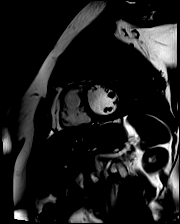

[Series 16: bSSFP · oblique · 8.0mm · 1.61mm/px · 1 of 25 slices shown (9 of 21)]
[im 1/25]
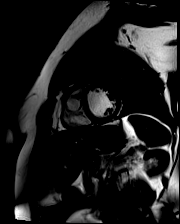

[Series 17: bSSFP · oblique · 8.0mm · 1.61mm/px · 1 of 25 slices shown (10 of 21)]
[im 1/25]
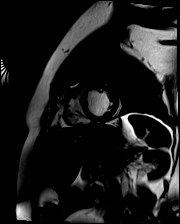

[Series 18: bSSFP · oblique · 8.0mm · 1.61mm/px · 1 of 25 slices shown (11 of 21)]
[im 1/25]
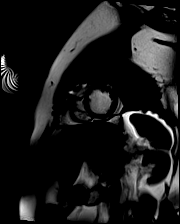

[Series 19: bSSFP · oblique · 8.0mm · 1.61mm/px · 1 of 25 slices shown (12 of 21)]
[im 1/25]
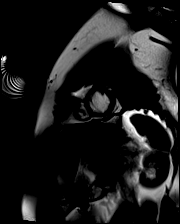

[Series 20: bSSFP · oblique · 8.0mm · 1.61mm/px · 1 of 25 slices shown (13 of 21)]
[im 1/25]
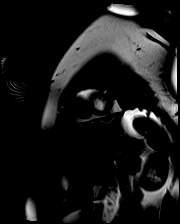

[Series 21: bSSFP · oblique · 8.0mm · 1.61mm/px · 1 of 25 slices shown (14 of 21)]
[im 1/25]
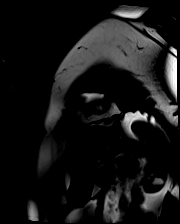

[Series 22: bSSFP · oblique · 8.0mm · 1.61mm/px · 1 of 25 slices shown (15 of 21)]
[im 1/25]
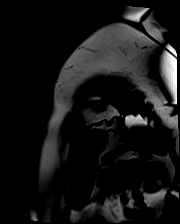

[Series 23: bSSFP · oblique · 8.0mm · 1.61mm/px · 1 of 25 slices shown (16 of 21)]
[im 1/25]
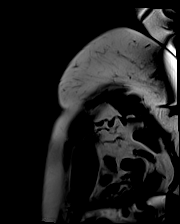

[Series 24: bSSFP · oblique · 8.0mm · 1.61mm/px · 1 of 25 slices shown (17 of 21)]
[im 1/25]
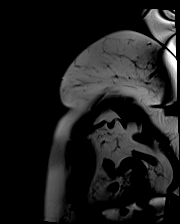

[Series 25: bSSFP · oblique · 6.0mm · 1.41mm/px · 1 of 25 slices shown (18 of 21)]
[im 1/25]
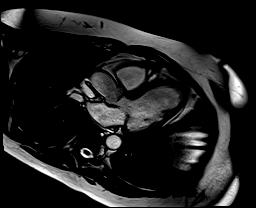

[Series 26: bSSFP · oblique · 6.0mm · 1.41mm/px · 1 of 25 slices shown (19 of 21)]
[im 1/25]
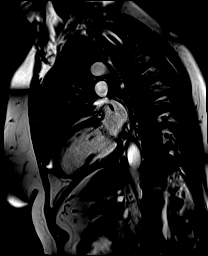

[Series 27: bSSFP · axial · 6.0mm · 1.41mm/px · 1 of 25 slices shown (20 of 21)]
[im 1/25]
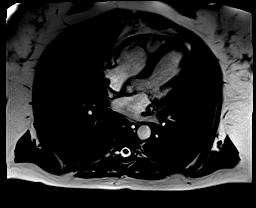

[Series 28: (id)_long_t1 · oblique · 8.0mm · 1.56mm/px · 1 of 24 slices shown]
[im 1/24]
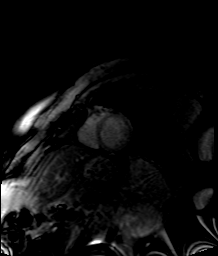

[Series 29: (id)_long_t1_moco · oblique · 8.0mm · 1.56mm/px · 1 of 24 slices shown]
[im 1/24]
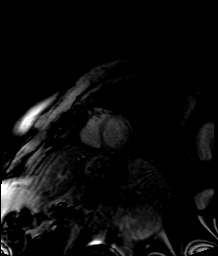

[Series 30: (id)_long_t1_moco_t1 · oblique · 8.0mm · 1.56mm/px · 1 of 6 slices shown]
[im 1/6]
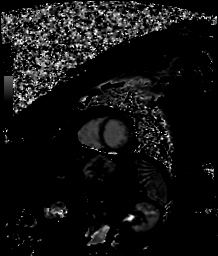

[Series 32: (id)_trufi · oblique · 8.0mm · 2.08mm/px · 1 of 9 slices shown]
[im 1/9]
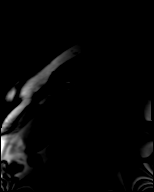

[Series 33: (id)_trufi_moco · oblique · 8.0mm · 2.08mm/px · 1 of 9 slices shown]
[im 1/9]
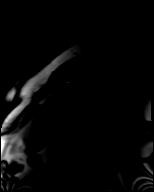

[Series 34: (id)_trufi_moco_t2 · oblique · 8.0mm · 2.08mm/px · 1 of 3 slices shown]
[im 1/3]
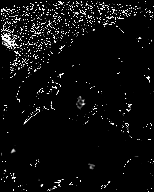

[Series 36: STIR · oblique · 8.0mm · 1.92mm/px · 1 of 16 slices shown]
[im 1/16]
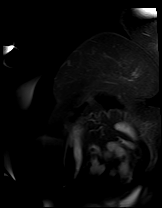

[Series 37: bSSFP · coronal · 6.0mm · 1.41mm/px · 1 of 25 slices shown (21 of 21)]
[im 1/25]
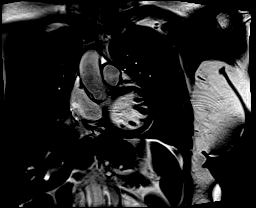

[Series 38: cine rvit · oblique · 6.0mm · 1.41mm/px · 1 of 25 slices shown]
[im 1/25]
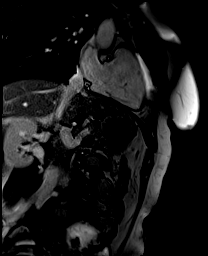

[Series 39: aortic valve cine · oblique · 6.0mm · 1.41mm/px · 1 of 25 slices shown]
[im 1/25]
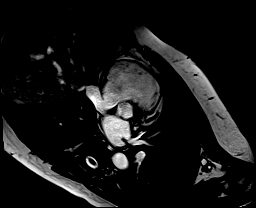

[Series 40: cine rvot · sagittal · 6.0mm · 1.41mm/px · 1 of 25 slices shown]
[im 1/25]
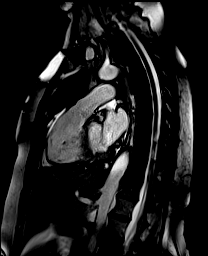

[Series 42: lge_single shot sa · oblique · 8.0mm · 2.08mm/px · 1 of 16 slices shown (1 of 2)]
[im 1/16]
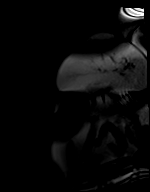

[Series 43: lge_single shot sa · oblique · 8.0mm · 2.08mm/px · 1 of 16 slices shown (2 of 2)]
[im 1/16]
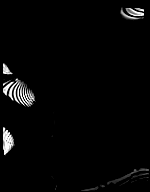

[Series 46: lge_single shot 4 · axial · 6.0mm · 1.98mm/px · 1 of 1 slices shown (1 of 2)]
[im 1/1]
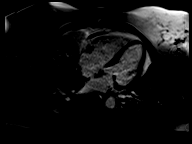

[Series 47: lge_single shot 4 · axial · 6.0mm · 1.98mm/px · 1 of 1 slices shown (2 of 2)]
[im 1/1]
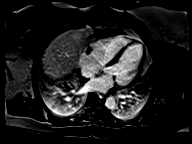

[Series 50: (id)_short_t1 · oblique · 8.0mm · 1.56mm/px · 1 of 27 slices shown]
[im 1/27]
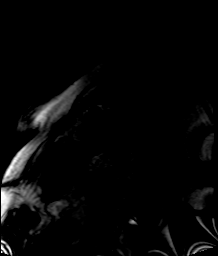

[Series 51: (id)_short_t1_moco · oblique · 8.0mm · 1.56mm/px · 1 of 27 slices shown]
[im 1/27]
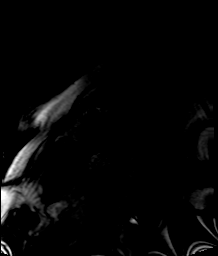

[Series 52: (id)_short_t1_moco_t1 · oblique · 8.0mm · 1.56mm/px · 1 of 4 slices shown]
[im 1/4]
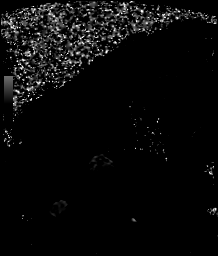

[Series 55: lge short axis · oblique · 8.0mm · 1.67mm/px · 1 of 16 slices shown (1 of 2)]
[im 1/16]
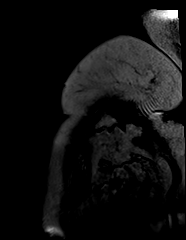

[Series 56: lge short axis · oblique · 8.0mm · 1.67mm/px · 1 of 16 slices shown (2 of 2)]
[im 1/16]
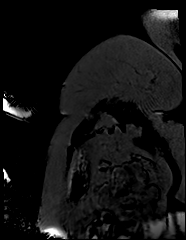

[Series 61: lge 4 ch · axial · 8.0mm · 1.67mm/px · 1 of 1 slices shown (1 of 2)]
[im 1/1]
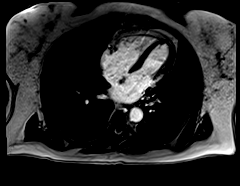

[Series 62: lge 4 ch · axial · 8.0mm · 1.67mm/px · 1 of 1 slices shown (2 of 2)]
[im 1/1]
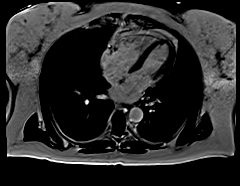

[43 of 46 positions shown; findings below may reference images not displayed]

FINDINGS: Left ventricle:

-Normal size

-Normal systolic function

-Normal ECV (26%, assuming Hct 40%)

-Basal inferolateral midwall LGE

LV EF:  61% (Normal 56-78%)

Absolute volumes:

LV EDV: 149mL (Normal 52-141 mL)

LV ESV: 58mL (Normal 13-51 mL)

LV SV: 91mL (Normal 33-97 mL)

CO: 5.5L/min (Normal 2.7-6.0 L/min)

Indexed volumes:

LV EDV: 75mL/sq-m (Normal 41-81 mL/sq-m)

LV ESV: 29mL/sq-m (Normal 12-21 mL/sq-m)

LV SV: 46mL/sq-m (Normal 26-56 mL/sq-m)

CI: 2.8L/min/sq-m (Normal 1.8-3.8 L/min/sq-m)

Right ventricle: Normal size and systolic function

RV EF: 58% (Normal 47-80%)

Absolute volumes:

RV EDV: 155mL (Normal 58-154 mL)

RV ESV: 64mL (Normal 12-68 mL)

RV SV: 90mL (Normal 35-98 mL)

CO: 5.4L/min (Normal 2.7-6 L/min)

Indexed volumes:

RV EDV: 78mL/sq-m (Normal 48-87 mL/sq-m)

RV ESV: 33mL/sq-m (Normal 11-28 mL/sq-m)

RV SV: 45mL/sq-m (Normal 27-57 mL/sq-m)

CI: 2.8L/min/sq-m (Normal 1.8-3.8 L/min/sq-m)

Left atrium: Normal size

Right atrium: Normal size

Mitral valve: No regurgitation

Aortic valve: No regurgitation

Tricuspid valve: No regurgitation

Pulmonic valve: No regurgitation

Aorta: Normal proximal ascending aorta

Pericardium: Normal
IMPRESSION: 1. There is basal inferolateral midwall LGE, which is a scar pattern
seen in nonischemic cardiomyopathies such as myocarditis or
sarcoidosis. Location is typical for myocarditis. Elevated T2 values
in basal inferior/inferolateral wall suggests myocardial edema

2.  Normal LV size and systolic function (EF 61%)

3.  Normal RV size and systolic function (EF 58%)

## 2022-07-13 DIAGNOSIS — D2262 Melanocytic nevi of left upper limb, including shoulder: Secondary | ICD-10-CM | POA: Diagnosis not present

## 2022-07-13 DIAGNOSIS — L821 Other seborrheic keratosis: Secondary | ICD-10-CM | POA: Diagnosis not present

## 2022-07-13 DIAGNOSIS — D2372 Other benign neoplasm of skin of left lower limb, including hip: Secondary | ICD-10-CM | POA: Diagnosis not present

## 2022-07-13 DIAGNOSIS — D225 Melanocytic nevi of trunk: Secondary | ICD-10-CM | POA: Diagnosis not present

## 2022-07-13 DIAGNOSIS — D224 Melanocytic nevi of scalp and neck: Secondary | ICD-10-CM | POA: Diagnosis not present

## 2022-07-13 DIAGNOSIS — L738 Other specified follicular disorders: Secondary | ICD-10-CM | POA: Diagnosis not present

## 2022-07-13 DIAGNOSIS — L814 Other melanin hyperpigmentation: Secondary | ICD-10-CM | POA: Diagnosis not present

## 2022-07-13 DIAGNOSIS — L603 Nail dystrophy: Secondary | ICD-10-CM | POA: Diagnosis not present

## 2022-08-07 DIAGNOSIS — Z23 Encounter for immunization: Secondary | ICD-10-CM | POA: Diagnosis not present

## 2022-08-29 DIAGNOSIS — E669 Obesity, unspecified: Secondary | ICD-10-CM | POA: Diagnosis not present

## 2022-08-29 DIAGNOSIS — I700 Atherosclerosis of aorta: Secondary | ICD-10-CM | POA: Diagnosis not present

## 2022-08-29 DIAGNOSIS — E785 Hyperlipidemia, unspecified: Secondary | ICD-10-CM | POA: Diagnosis not present

## 2022-08-29 DIAGNOSIS — I10 Essential (primary) hypertension: Secondary | ICD-10-CM | POA: Diagnosis not present

## 2022-08-29 DIAGNOSIS — N1831 Chronic kidney disease, stage 3a: Secondary | ICD-10-CM | POA: Diagnosis not present

## 2022-08-29 DIAGNOSIS — G4733 Obstructive sleep apnea (adult) (pediatric): Secondary | ICD-10-CM | POA: Diagnosis not present

## 2022-08-29 DIAGNOSIS — R7301 Impaired fasting glucose: Secondary | ICD-10-CM | POA: Diagnosis not present

## 2022-08-29 DIAGNOSIS — I2584 Coronary atherosclerosis due to calcified coronary lesion: Secondary | ICD-10-CM | POA: Diagnosis not present

## 2022-08-29 DIAGNOSIS — E039 Hypothyroidism, unspecified: Secondary | ICD-10-CM | POA: Diagnosis not present

## 2022-09-06 DIAGNOSIS — D8685 Sarcoid myocarditis: Secondary | ICD-10-CM | POA: Diagnosis not present

## 2022-09-13 DIAGNOSIS — I493 Ventricular premature depolarization: Secondary | ICD-10-CM | POA: Diagnosis not present

## 2022-09-18 ENCOUNTER — Ambulatory Visit: Payer: Medicare PPO | Admitting: Adult Health

## 2022-11-14 ENCOUNTER — Telehealth: Payer: Self-pay | Admitting: Cardiology

## 2022-11-14 NOTE — Telephone Encounter (Signed)
Called patient to schedule appt from [place], she said she is doing well and believes she doesn't need to schedule an appt.  If she needs her she will give [place] a call.

## 2023-02-20 DIAGNOSIS — D649 Anemia, unspecified: Secondary | ICD-10-CM | POA: Diagnosis not present

## 2023-02-20 DIAGNOSIS — K219 Gastro-esophageal reflux disease without esophagitis: Secondary | ICD-10-CM | POA: Diagnosis not present

## 2023-02-20 DIAGNOSIS — R7301 Impaired fasting glucose: Secondary | ICD-10-CM | POA: Diagnosis not present

## 2023-02-20 DIAGNOSIS — E785 Hyperlipidemia, unspecified: Secondary | ICD-10-CM | POA: Diagnosis not present

## 2023-02-20 DIAGNOSIS — E039 Hypothyroidism, unspecified: Secondary | ICD-10-CM | POA: Diagnosis not present

## 2023-02-20 DIAGNOSIS — Z1212 Encounter for screening for malignant neoplasm of rectum: Secondary | ICD-10-CM | POA: Diagnosis not present

## 2023-02-20 DIAGNOSIS — Z0000 Encounter for general adult medical examination without abnormal findings: Secondary | ICD-10-CM | POA: Diagnosis not present

## 2023-02-20 DIAGNOSIS — H31002 Unspecified chorioretinal scars, left eye: Secondary | ICD-10-CM | POA: Diagnosis not present

## 2023-02-26 DIAGNOSIS — Z1212 Encounter for screening for malignant neoplasm of rectum: Secondary | ICD-10-CM | POA: Diagnosis not present

## 2023-02-26 DIAGNOSIS — D649 Anemia, unspecified: Secondary | ICD-10-CM | POA: Diagnosis not present

## 2023-02-27 DIAGNOSIS — I5189 Other ill-defined heart diseases: Secondary | ICD-10-CM | POA: Diagnosis not present

## 2023-02-27 DIAGNOSIS — I129 Hypertensive chronic kidney disease with stage 1 through stage 4 chronic kidney disease, or unspecified chronic kidney disease: Secondary | ICD-10-CM | POA: Diagnosis not present

## 2023-02-27 DIAGNOSIS — M7500 Adhesive capsulitis of unspecified shoulder: Secondary | ICD-10-CM | POA: Diagnosis not present

## 2023-02-27 DIAGNOSIS — Z0000 Encounter for general adult medical examination without abnormal findings: Secondary | ICD-10-CM | POA: Diagnosis not present

## 2023-02-27 DIAGNOSIS — R7301 Impaired fasting glucose: Secondary | ICD-10-CM | POA: Diagnosis not present

## 2023-02-27 DIAGNOSIS — K529 Noninfective gastroenteritis and colitis, unspecified: Secondary | ICD-10-CM | POA: Diagnosis not present

## 2023-02-27 DIAGNOSIS — R82998 Other abnormal findings in urine: Secondary | ICD-10-CM | POA: Diagnosis not present

## 2023-02-27 DIAGNOSIS — N1831 Chronic kidney disease, stage 3a: Secondary | ICD-10-CM | POA: Diagnosis not present

## 2023-02-27 DIAGNOSIS — I10 Essential (primary) hypertension: Secondary | ICD-10-CM | POA: Diagnosis not present

## 2023-02-27 DIAGNOSIS — E039 Hypothyroidism, unspecified: Secondary | ICD-10-CM | POA: Diagnosis not present

## 2023-02-27 DIAGNOSIS — I700 Atherosclerosis of aorta: Secondary | ICD-10-CM | POA: Diagnosis not present

## 2023-04-10 DIAGNOSIS — Z1231 Encounter for screening mammogram for malignant neoplasm of breast: Secondary | ICD-10-CM | POA: Diagnosis not present

## 2023-08-29 DIAGNOSIS — I129 Hypertensive chronic kidney disease with stage 1 through stage 4 chronic kidney disease, or unspecified chronic kidney disease: Secondary | ICD-10-CM | POA: Diagnosis not present

## 2023-08-29 DIAGNOSIS — Z23 Encounter for immunization: Secondary | ICD-10-CM | POA: Diagnosis not present

## 2023-08-29 DIAGNOSIS — N183 Chronic kidney disease, stage 3 unspecified: Secondary | ICD-10-CM | POA: Diagnosis not present

## 2023-08-29 DIAGNOSIS — M159 Polyosteoarthritis, unspecified: Secondary | ICD-10-CM | POA: Diagnosis not present

## 2023-08-29 DIAGNOSIS — N6019 Diffuse cystic mastopathy of unspecified breast: Secondary | ICD-10-CM | POA: Diagnosis not present

## 2023-08-29 DIAGNOSIS — E039 Hypothyroidism, unspecified: Secondary | ICD-10-CM | POA: Diagnosis not present

## 2023-08-29 DIAGNOSIS — R7301 Impaired fasting glucose: Secondary | ICD-10-CM | POA: Diagnosis not present

## 2023-08-29 DIAGNOSIS — I700 Atherosclerosis of aorta: Secondary | ICD-10-CM | POA: Diagnosis not present

## 2023-08-29 DIAGNOSIS — N1831 Chronic kidney disease, stage 3a: Secondary | ICD-10-CM | POA: Diagnosis not present

## 2023-09-06 DIAGNOSIS — D224 Melanocytic nevi of scalp and neck: Secondary | ICD-10-CM | POA: Diagnosis not present

## 2023-09-06 DIAGNOSIS — D2239 Melanocytic nevi of other parts of face: Secondary | ICD-10-CM | POA: Diagnosis not present

## 2023-09-06 DIAGNOSIS — D2262 Melanocytic nevi of left upper limb, including shoulder: Secondary | ICD-10-CM | POA: Diagnosis not present

## 2023-09-06 DIAGNOSIS — L821 Other seborrheic keratosis: Secondary | ICD-10-CM | POA: Diagnosis not present

## 2023-09-06 DIAGNOSIS — D225 Melanocytic nevi of trunk: Secondary | ICD-10-CM | POA: Diagnosis not present

## 2023-09-06 DIAGNOSIS — I788 Other diseases of capillaries: Secondary | ICD-10-CM | POA: Diagnosis not present

## 2023-09-06 DIAGNOSIS — L603 Nail dystrophy: Secondary | ICD-10-CM | POA: Diagnosis not present

## 2023-09-06 DIAGNOSIS — L814 Other melanin hyperpigmentation: Secondary | ICD-10-CM | POA: Diagnosis not present

## 2023-09-06 DIAGNOSIS — L918 Other hypertrophic disorders of the skin: Secondary | ICD-10-CM | POA: Diagnosis not present

## 2024-02-21 DIAGNOSIS — Z1212 Encounter for screening for malignant neoplasm of rectum: Secondary | ICD-10-CM | POA: Diagnosis not present

## 2024-02-21 DIAGNOSIS — D649 Anemia, unspecified: Secondary | ICD-10-CM | POA: Diagnosis not present

## 2024-02-21 DIAGNOSIS — I129 Hypertensive chronic kidney disease with stage 1 through stage 4 chronic kidney disease, or unspecified chronic kidney disease: Secondary | ICD-10-CM | POA: Diagnosis not present

## 2024-02-21 DIAGNOSIS — N1831 Chronic kidney disease, stage 3a: Secondary | ICD-10-CM | POA: Diagnosis not present

## 2024-02-21 DIAGNOSIS — R7301 Impaired fasting glucose: Secondary | ICD-10-CM | POA: Diagnosis not present

## 2024-02-21 DIAGNOSIS — E039 Hypothyroidism, unspecified: Secondary | ICD-10-CM | POA: Diagnosis not present

## 2024-02-21 DIAGNOSIS — E785 Hyperlipidemia, unspecified: Secondary | ICD-10-CM | POA: Diagnosis not present

## 2024-02-27 DIAGNOSIS — D649 Anemia, unspecified: Secondary | ICD-10-CM | POA: Diagnosis not present

## 2024-02-27 DIAGNOSIS — E039 Hypothyroidism, unspecified: Secondary | ICD-10-CM | POA: Diagnosis not present

## 2024-02-27 DIAGNOSIS — Z1212 Encounter for screening for malignant neoplasm of rectum: Secondary | ICD-10-CM | POA: Diagnosis not present

## 2024-02-28 DIAGNOSIS — Z0000 Encounter for general adult medical examination without abnormal findings: Secondary | ICD-10-CM | POA: Diagnosis not present

## 2024-02-28 DIAGNOSIS — M159 Polyosteoarthritis, unspecified: Secondary | ICD-10-CM | POA: Diagnosis not present

## 2024-02-28 DIAGNOSIS — I1310 Hypertensive heart and chronic kidney disease without heart failure, with stage 1 through stage 4 chronic kidney disease, or unspecified chronic kidney disease: Secondary | ICD-10-CM | POA: Diagnosis not present

## 2024-02-28 DIAGNOSIS — E669 Obesity, unspecified: Secondary | ICD-10-CM | POA: Diagnosis not present

## 2024-02-28 DIAGNOSIS — E785 Hyperlipidemia, unspecified: Secondary | ICD-10-CM | POA: Diagnosis not present

## 2024-02-28 DIAGNOSIS — Z1339 Encounter for screening examination for other mental health and behavioral disorders: Secondary | ICD-10-CM | POA: Diagnosis not present

## 2024-02-28 DIAGNOSIS — E039 Hypothyroidism, unspecified: Secondary | ICD-10-CM | POA: Diagnosis not present

## 2024-02-28 DIAGNOSIS — N1831 Chronic kidney disease, stage 3a: Secondary | ICD-10-CM | POA: Diagnosis not present

## 2024-02-28 DIAGNOSIS — I4719 Other supraventricular tachycardia: Secondary | ICD-10-CM | POA: Diagnosis not present

## 2024-02-28 DIAGNOSIS — N6019 Diffuse cystic mastopathy of unspecified breast: Secondary | ICD-10-CM | POA: Diagnosis not present

## 2024-02-28 DIAGNOSIS — R82998 Other abnormal findings in urine: Secondary | ICD-10-CM | POA: Diagnosis not present

## 2024-02-28 DIAGNOSIS — Z1331 Encounter for screening for depression: Secondary | ICD-10-CM | POA: Diagnosis not present

## 2024-04-14 DIAGNOSIS — Z1231 Encounter for screening mammogram for malignant neoplasm of breast: Secondary | ICD-10-CM | POA: Diagnosis not present

## 2024-05-07 DIAGNOSIS — H43813 Vitreous degeneration, bilateral: Secondary | ICD-10-CM | POA: Diagnosis not present

## 2024-09-03 DIAGNOSIS — I4719 Other supraventricular tachycardia: Secondary | ICD-10-CM | POA: Diagnosis not present

## 2024-09-03 DIAGNOSIS — E669 Obesity, unspecified: Secondary | ICD-10-CM | POA: Diagnosis not present

## 2024-09-03 DIAGNOSIS — Z23 Encounter for immunization: Secondary | ICD-10-CM | POA: Diagnosis not present

## 2024-09-03 DIAGNOSIS — N1831 Chronic kidney disease, stage 3a: Secondary | ICD-10-CM | POA: Diagnosis not present

## 2024-09-03 DIAGNOSIS — E785 Hyperlipidemia, unspecified: Secondary | ICD-10-CM | POA: Diagnosis not present

## 2024-09-03 DIAGNOSIS — I700 Atherosclerosis of aorta: Secondary | ICD-10-CM | POA: Diagnosis not present

## 2024-09-03 DIAGNOSIS — R7301 Impaired fasting glucose: Secondary | ICD-10-CM | POA: Diagnosis not present

## 2024-09-03 DIAGNOSIS — I129 Hypertensive chronic kidney disease with stage 1 through stage 4 chronic kidney disease, or unspecified chronic kidney disease: Secondary | ICD-10-CM | POA: Diagnosis not present

## 2024-09-03 DIAGNOSIS — E039 Hypothyroidism, unspecified: Secondary | ICD-10-CM | POA: Diagnosis not present

## 2024-10-01 DIAGNOSIS — L918 Other hypertrophic disorders of the skin: Secondary | ICD-10-CM | POA: Diagnosis not present

## 2024-10-01 DIAGNOSIS — D225 Melanocytic nevi of trunk: Secondary | ICD-10-CM | POA: Diagnosis not present

## 2024-10-01 DIAGNOSIS — D2261 Melanocytic nevi of right upper limb, including shoulder: Secondary | ICD-10-CM | POA: Diagnosis not present

## 2024-10-01 DIAGNOSIS — D224 Melanocytic nevi of scalp and neck: Secondary | ICD-10-CM | POA: Diagnosis not present

## 2024-10-01 DIAGNOSIS — L814 Other melanin hyperpigmentation: Secondary | ICD-10-CM | POA: Diagnosis not present

## 2024-10-01 DIAGNOSIS — L738 Other specified follicular disorders: Secondary | ICD-10-CM | POA: Diagnosis not present

## 2024-10-01 DIAGNOSIS — L821 Other seborrheic keratosis: Secondary | ICD-10-CM | POA: Diagnosis not present

## 2024-10-01 DIAGNOSIS — D2239 Melanocytic nevi of other parts of face: Secondary | ICD-10-CM | POA: Diagnosis not present

## 2024-10-01 DIAGNOSIS — D2262 Melanocytic nevi of left upper limb, including shoulder: Secondary | ICD-10-CM | POA: Diagnosis not present
